# Patient Record
Sex: Male | Born: 1940 | Race: White | Hispanic: No | Smoking: Current every day smoker
Health system: Southern US, Community
[De-identification: ages and names within clinical notes are randomized; demographics above are authoritative.]

## PROBLEM LIST (undated history)

## (undated) DIAGNOSIS — I10 Essential (primary) hypertension: Secondary | ICD-10-CM

## (undated) DIAGNOSIS — C801 Malignant (primary) neoplasm, unspecified: Secondary | ICD-10-CM

## (undated) DIAGNOSIS — M1990 Unspecified osteoarthritis, unspecified site: Secondary | ICD-10-CM

## (undated) HISTORY — PX: SKIN CANCER EXCISION: SHX779

## (undated) HISTORY — PX: EXTERNAL EAR SURGERY: SHX627

---

## 2006-08-17 ENCOUNTER — Ambulatory Visit: Payer: Self-pay | Admitting: Family Medicine

## 2010-08-17 DIAGNOSIS — L570 Actinic keratosis: Secondary | ICD-10-CM | POA: Insufficient documentation

## 2010-10-13 DIAGNOSIS — C44320 Squamous cell carcinoma of skin of unspecified parts of face: Secondary | ICD-10-CM | POA: Insufficient documentation

## 2011-05-01 DIAGNOSIS — C4441 Basal cell carcinoma of skin of scalp and neck: Secondary | ICD-10-CM | POA: Insufficient documentation

## 2012-08-11 ENCOUNTER — Emergency Department: Payer: Self-pay | Admitting: Emergency Medicine

## 2012-08-11 LAB — CBC
HCT: 37.8 % — ABNORMAL LOW (ref 40.0–52.0)
MCHC: 34.5 g/dL (ref 32.0–36.0)
MCV: 87 fL (ref 80–100)
Platelet: 160 10*3/uL (ref 150–440)
RBC: 4.35 10*6/uL — ABNORMAL LOW (ref 4.40–5.90)
RDW: 14.1 % (ref 11.5–14.5)
WBC: 9.8 10*3/uL (ref 3.8–10.6)

## 2012-08-26 ENCOUNTER — Emergency Department: Payer: Self-pay | Admitting: Emergency Medicine

## 2014-03-09 DIAGNOSIS — R634 Abnormal weight loss: Secondary | ICD-10-CM | POA: Insufficient documentation

## 2014-03-09 DIAGNOSIS — R61 Generalized hyperhidrosis: Secondary | ICD-10-CM | POA: Insufficient documentation

## 2014-05-06 DIAGNOSIS — Z85828 Personal history of other malignant neoplasm of skin: Secondary | ICD-10-CM | POA: Insufficient documentation

## 2014-11-04 DIAGNOSIS — Z720 Tobacco use: Secondary | ICD-10-CM | POA: Insufficient documentation

## 2015-08-24 ENCOUNTER — Encounter: Payer: Self-pay | Admitting: *Deleted

## 2015-08-26 ENCOUNTER — Encounter
Admission: RE | Disposition: A | Discharge status: Home / Self Care | Payer: Self-pay | Source: Ambulatory Visit | Attending: Ophthalmology

## 2015-08-26 ENCOUNTER — Ambulatory Visit: Payer: Medicare Other | Admitting: Anesthesiology

## 2015-08-26 ENCOUNTER — Ambulatory Visit
Admission: RE | Admit: 2015-08-26 | Discharge: 2015-08-26 | Disposition: A | Discharge status: Home / Self Care | Payer: Medicare Other | Source: Ambulatory Visit | Attending: Ophthalmology | Admitting: Ophthalmology

## 2015-08-26 ENCOUNTER — Encounter: Payer: Self-pay | Admitting: *Deleted

## 2015-08-26 DIAGNOSIS — Z79899 Other long term (current) drug therapy: Secondary | ICD-10-CM | POA: Diagnosis not present

## 2015-08-26 DIAGNOSIS — E7800 Pure hypercholesterolemia, unspecified: Secondary | ICD-10-CM | POA: Diagnosis not present

## 2015-08-26 DIAGNOSIS — R05 Cough: Secondary | ICD-10-CM | POA: Diagnosis not present

## 2015-08-26 DIAGNOSIS — Z85828 Personal history of other malignant neoplasm of skin: Secondary | ICD-10-CM | POA: Diagnosis not present

## 2015-08-26 DIAGNOSIS — H2511 Age-related nuclear cataract, right eye: Secondary | ICD-10-CM | POA: Insufficient documentation

## 2015-08-26 DIAGNOSIS — M1990 Unspecified osteoarthritis, unspecified site: Secondary | ICD-10-CM | POA: Insufficient documentation

## 2015-08-26 DIAGNOSIS — F17200 Nicotine dependence, unspecified, uncomplicated: Secondary | ICD-10-CM | POA: Insufficient documentation

## 2015-08-26 DIAGNOSIS — I10 Essential (primary) hypertension: Secondary | ICD-10-CM | POA: Diagnosis not present

## 2015-08-26 HISTORY — DX: Essential (primary) hypertension: I10

## 2015-08-26 HISTORY — PX: CATARACT EXTRACTION W/PHACO: SHX586

## 2015-08-26 HISTORY — DX: Unspecified osteoarthritis, unspecified site: M19.90

## 2015-08-26 HISTORY — DX: Malignant (primary) neoplasm, unspecified: C80.1

## 2015-08-26 SURGERY — PHACOEMULSIFICATION, CATARACT, WITH IOL INSERTION
Anesthesia: Monitor Anesthesia Care | Site: Eye | Laterality: Right | Wound class: Clean

## 2015-08-26 MED ORDER — MOXIFLOXACIN HCL 0.5 % OP SOLN
OPHTHALMIC | Status: AC
  Filled 2015-08-26: qty 3

## 2015-08-26 MED ORDER — MOXIFLOXACIN HCL 0.5 % OP SOLN: 1.0000 [drp] | OPHTHALMIC | Status: DC | PRN

## 2015-08-26 MED ORDER — TETRACAINE HCL 0.5 % OP SOLN
1.0000 [drp] | Freq: Once | OPHTHALMIC | Status: AC
  Administered 2015-08-26: 1 [drp] via OPHTHALMIC

## 2015-08-26 MED ORDER — TETRACAINE HCL 0.5 % OP SOLN
OPHTHALMIC | Status: AC
  Administered 2015-08-26: 1 [drp] via OPHTHALMIC
  Filled 2015-08-26: qty 2

## 2015-08-26 MED ORDER — NA CHONDROIT SULF-NA HYALURON 40-17 MG/ML IO SOLN
INTRAOCULAR | Status: DC | PRN
  Administered 2015-08-26: 1 mL via INTRAOCULAR

## 2015-08-26 MED ORDER — BSS IO SOLN
INTRAOCULAR | Status: DC | PRN
  Administered 2015-08-26: 200 mL via OPHTHALMIC

## 2015-08-26 MED ORDER — MOXIFLOXACIN HCL 0.5 % OP SOLN
OPHTHALMIC | Status: DC | PRN
  Administered 2015-08-26: 1 [drp] via OPHTHALMIC

## 2015-08-26 MED ORDER — MIDAZOLAM HCL 5 MG/5ML IJ SOLN
INTRAMUSCULAR | Status: DC | PRN
  Administered 2015-08-26: 1 mg via INTRAVENOUS

## 2015-08-26 MED ORDER — NA HYALUR & NA CHOND-NA HYALUR 0.55-0.5 ML IO KIT
PACK | INTRAOCULAR | Status: AC
  Filled 2015-08-26: qty 1.05

## 2015-08-26 MED ORDER — POVIDONE-IODINE 5 % OP SOLN
OPHTHALMIC | Status: AC
  Administered 2015-08-26: 1 via OPHTHALMIC
  Filled 2015-08-26: qty 30

## 2015-08-26 MED ORDER — CEFUROXIME OPHTHALMIC INJECTION 1 MG/0.1 ML
INJECTION | OPHTHALMIC | Status: AC
  Filled 2015-08-26: qty 0.1

## 2015-08-26 MED ORDER — POLYMYXIN B-TRIMETHOPRIM 10000-0.1 UNIT/ML-% OP SOLN
OPHTHALMIC | Status: DC | PRN
  Administered 2015-08-26: 1 [drp] via OPHTHALMIC

## 2015-08-26 MED ORDER — EPINEPHRINE HCL 1 MG/ML IJ SOLN
INTRAMUSCULAR | Status: AC
  Filled 2015-08-26: qty 1

## 2015-08-26 MED ORDER — SODIUM CHLORIDE 0.9 % IV SOLN
INTRAVENOUS | Status: DC
  Administered 2015-08-26: 08:00:00 via INTRAVENOUS

## 2015-08-26 MED ORDER — BSS IO SOLN
INTRAOCULAR | Status: DC | PRN
  Administered 2015-08-26: 4 mL via OPHTHALMIC

## 2015-08-26 MED ORDER — POVIDONE-IODINE 5 % OP SOLN
1.0000 "application " | Freq: Once | OPHTHALMIC | Status: AC
  Administered 2015-08-26: 1 via OPHTHALMIC

## 2015-08-26 MED ORDER — ARMC OPHTHALMIC DILATING GEL
1.0000 | OPHTHALMIC | Status: AC | PRN
Start: 2015-08-26 — End: 2015-08-26
  Administered 2015-08-26 (×2): 1 via OPHTHALMIC

## 2015-08-26 MED ORDER — ARMC OPHTHALMIC DILATING GEL
OPHTHALMIC | Status: AC
  Administered 2015-08-26: 1 via OPHTHALMIC
  Filled 2015-08-26: qty 0.25

## 2015-08-26 MED ORDER — CEFUROXIME OPHTHALMIC INJECTION 1 MG/0.1 ML
INJECTION | OPHTHALMIC | Status: DC | PRN
  Administered 2015-08-26: 0.1 mL via INTRACAMERAL

## 2015-08-26 SURGICAL SUPPLY — 22 items
CANNULA ANT/CHMB 27GA (MISCELLANEOUS) ×3 IMPLANT
CUP MEDICINE 2OZ PLAST GRAD ST (MISCELLANEOUS) ×3 IMPLANT
GLOVE BIO SURGEON STRL SZ8 (GLOVE) ×3 IMPLANT
GLOVE BIOGEL M 6.5 STRL (GLOVE) ×3 IMPLANT
GLOVE SURG LX 7.5 STRW (GLOVE) ×2
GLOVE SURG LX STRL 7.5 STRW (GLOVE) ×1 IMPLANT
GOWN STRL REUS W/ TWL LRG LVL3 (GOWN DISPOSABLE) ×2 IMPLANT
GOWN STRL REUS W/TWL LRG LVL3 (GOWN DISPOSABLE) ×4
LENS IOL ACRSF IQ PC 25.0 (Intraocular Lens) ×1 IMPLANT
LENS IOL ACRYSOF IQ POST 25.0 (Intraocular Lens) ×3 IMPLANT
PACK CATARACT (MISCELLANEOUS) ×3 IMPLANT
PACK CATARACT BRASINGTON LX (MISCELLANEOUS) ×3 IMPLANT
PACK EYE AFTER SURG (MISCELLANEOUS) ×3 IMPLANT
SOL BSS BAG (MISCELLANEOUS) ×3
SOL PREP PVP 2OZ (MISCELLANEOUS) ×3
SOLUTION BSS BAG (MISCELLANEOUS) ×1 IMPLANT
SOLUTION PREP PVP 2OZ (MISCELLANEOUS) ×1 IMPLANT
SYR 3ML LL SCALE MARK (SYRINGE) ×3 IMPLANT
SYR 5ML LL (SYRINGE) ×3 IMPLANT
SYR TB 1ML 27GX1/2 LL (SYRINGE) ×3 IMPLANT
WATER STERILE IRR 1000ML POUR (IV SOLUTION) ×3 IMPLANT
WIPE NON LINTING 3.25X3.25 (MISCELLANEOUS) ×3 IMPLANT

## 2015-08-26 NOTE — Discharge Instructions (Signed)
Eye Surgery Discharge Instructions  Expect mild scratchy sensation or mild soreness. DO NOT RUB YOUR EYE!  The day of surgery:  Minimal physical activity, but bed rest is not required  No reading, computer work, or close hand work  No bending, lifting, or straining.  May watch TV  For 24 hours:  No driving, legal decisions, or alcoholic beverages  Safety precautions  Eat anything you prefer: It is better to start with liquids, then soup then solid foods.  _____ Eye patch should be worn until postoperative exam tomorrow.  ____ Solar shield eyeglasses should be worn for comfort in the sunlight/patch while sleeping  Resume all regular medications including aspirin or Coumadin if these were discontinued prior to surgery. You may shower, bathe, shave, or wash your hair. Tylenol may be taken for mild discomfort.  Call your doctor if you experience significant pain, nausea, or vomiting, fever > 101 or other signs of infection. [phone] or [phone] Specific instructions:  Follow-up Information    Follow up with Benay Pillow, MD On 08/27/2015.   Specialty:  Ophthalmology   Why:  9:40   Contact information:   [address] [place] [address] [phone]

## 2015-08-26 NOTE — H&P (Signed)
  The History and Physical notes are on paper, have been signed, and are to be scanned. The patient remains stable and unchanged from the H&P.   Previous H&P reviewed, patient examined, and there are no changes.  Benay Pillow 08/26/2015 8:59 AM

## 2015-08-26 NOTE — Transfer of Care (Signed)
Immediate Anesthesia Transfer of Care Note  Patient: Alexander Rowe  Procedure(s) Performed: Procedure(s) with comments: CATARACT EXTRACTION PHACO AND INTRAOCULAR LENS PLACEMENT (IOC) (Right) - Lot [id number] [place]: 02:13.9 AP%: 17.2 CDE: 22.94  Patient Location: PACU  Anesthesia Type:MAC  Level of Consciousness: awake, alert , oriented and patient cooperative  Airway & Oxygen Therapy: Patient Spontanous Breathing  Post-op Assessment: Report given to RN, Post -op Vital signs reviewed and stable and Patient moving all extremities X 4  Post vital signs: Reviewed and stable  Last Vitals:  Filed Vitals:   08/26/15 0744  BP: 173/93  Pulse: 59  Temp: 35.3 C  Resp: 16    Last Pain: There were no vitals filed for this visit.       Complications: No apparent anesthesia complications

## 2015-08-26 NOTE — Op Note (Signed)
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 Item Serial No. Manufacturer Lot No. LRB No. Used  IMPLANT LENS - [id number] Intraocular Lens IMPLANT LENS [id number] ALCON   Right 1    25.0 D [id number]   ULTRASOUND TIME: 2 minutes 14 seconds, CDE 22.94  SURGEON:  Benay Pillow, MD   ANESTHESIA:  Topical with tetracaine drops and 2% Xylocaine jelly, augmented with 1% preservative-free intracameral lidocaine.    COMPLICATIONS:  None.   DESCRIPTION OF PROCEDURE:  The patient was identified in the holding room and transported to the operating room and placed in the supine position under the operating microscope. Theright eye was identified as the operative eye and it was prepped and draped in the usual sterile ophthalmic fashion.   A 1 millimeter clear-corneal paracentesis was made at the 10:00  position.  0.5 ml of preservative-free 1% lidocaine and epinephrine was injected into the anterior chamber. The anterior chamber was filled with Viscoat viscoelastic.  A 2.4 millimeter keratome was used to make a near-clear corneal incision at the 8:00 position. A curvilinear capsulorrhexis was made with a cystotome and capsulorrhexis forceps.  Balanced salt solution was used to hydrodissect and hydrodelineate the nucleus.   Phacoemulsification was then used in stop and chop fashion to remove the lens nucleus and epinucleus.  The remaining cortex was then removed using the irrigation and aspiration handpiece. Provisc was then placed into the capsular bag to distend it for lens placement.  A lens was then injected into the capsular bag.  The remaining viscoelastic was aspirated.  Wounds were hydrated with balanced  salt solution.  The anterior chamber was inflated to a physiologic pressure with balanced salt solution.  Cefuroxime 0.1 ml of a 10mg /ml solution was injected into the anterior chamber for a dose of 1 mg of intracameral antibiotic at the completion of the case.  No wound leaks were noted.  Topical Vigamox drops were applied to the eye.  The patient was taken to the recovery room in stable condition without complications of anesthesia or surgery.  Benay Pillow 08/26/2015, 9:50 AM

## 2015-08-26 NOTE — Anesthesia Preprocedure Evaluation (Signed)
Anesthesia Evaluation  Patient identified by MRN, date of birth, ID band Patient awake    Reviewed: Allergy & Precautions, H&P , NPO status , Patient's Chart, lab work & pertinent test results, reviewed documented beta blocker date and time   History of Anesthesia Complications Negative for: history of anesthetic complications  Airway Mallampati: I  TM Distance: >3 FB Neck ROM: full    Dental no notable dental hx. (+) Edentulous Upper, Edentulous Lower, Upper Dentures, Lower Dentures   Pulmonary neg shortness of breath, neg sleep apnea, COPD, Recent URI , Current Smoker,    Pulmonary exam normal breath sounds clear to auscultation       Cardiovascular Exercise Tolerance: Good hypertension, On Medications (-) angina(-) CAD, (-) Past MI, (-) Cardiac Stents and (-) CABG Normal cardiovascular exam(-) dysrhythmias (-) Valvular Problems/Murmurs Rhythm:regular Rate:Normal     Neuro/Psych negative neurological ROS  negative psych ROS   GI/Hepatic negative GI ROS, Neg liver ROS,   Endo/Other  negative endocrine ROS  Renal/GU negative Renal ROS  negative genitourinary   Musculoskeletal   Abdominal   Peds  Hematology negative hematology ROS (+)   Anesthesia Other Findings Past Medical History:   Hypertension                                                 Arthritis                                                    Cancer (HCC)                                                   Comment:skin   Cough                                                          Comment:chronic   Sinus drainage                                               Reproductive/Obstetrics negative OB ROS                             Anesthesia Physical Anesthesia Plan  ASA: II  Anesthesia Plan: MAC   Post-op Pain Management:    Induction:   Airway Management Planned:   Additional Equipment:   Intra-op Plan:    Post-operative Plan:   Informed Consent: I have reviewed the patients History and Physical, chart, labs and discussed the procedure including the risks, benefits and alternatives for the proposed anesthesia with the patient or authorized representative who has indicated his/her understanding and acceptance.   Dental Advisory Given  Plan Discussed with: Anesthesiologist, CRNA and Surgeon  Anesthesia Plan Comments:  Anesthesia Quick Evaluation  

## 2015-08-26 NOTE — Anesthesia Postprocedure Evaluation (Signed)
Anesthesia Post Note  Patient: Alexander Rowe  Procedure(s) Performed: Procedure(s) (LRB): CATARACT EXTRACTION PHACO AND INTRAOCULAR LENS PLACEMENT (IOC) (Right)  Patient location during evaluation: PACU Anesthesia Type: MAC Level of consciousness: awake and alert Pain management: satisfactory to patient Vital Signs Assessment: post-procedure vital signs reviewed and stable Respiratory status: respiratory function stable Cardiovascular status: stable Anesthetic complications: no    Last Vitals:  Filed Vitals:   08/26/15 0744 08/26/15 0952  BP: 173/93 185/90  Pulse: 59 57  Temp: 35.3 C   Resp: 16 14    Last Pain:  Filed Vitals:   08/26/15 0953  PainSc: 0-No pain                 Silvana Newness A

## 2015-09-28 ENCOUNTER — Encounter: Payer: Self-pay | Admitting: *Deleted

## 2015-09-30 ENCOUNTER — Encounter
Admission: RE | Disposition: A | Discharge status: Home / Self Care | Payer: Self-pay | Source: Ambulatory Visit | Attending: Ophthalmology

## 2015-09-30 ENCOUNTER — Ambulatory Visit
Admission: RE | Admit: 2015-09-30 | Discharge: 2015-09-30 | Disposition: A | Discharge status: Home / Self Care | Payer: Medicare Other | Source: Ambulatory Visit | Attending: Ophthalmology | Admitting: Ophthalmology

## 2015-09-30 ENCOUNTER — Ambulatory Visit: Payer: Medicare Other | Admitting: Anesthesiology

## 2015-09-30 ENCOUNTER — Encounter: Payer: Self-pay | Admitting: *Deleted

## 2015-09-30 DIAGNOSIS — Z85828 Personal history of other malignant neoplasm of skin: Secondary | ICD-10-CM | POA: Diagnosis not present

## 2015-09-30 DIAGNOSIS — H2512 Age-related nuclear cataract, left eye: Secondary | ICD-10-CM | POA: Diagnosis not present

## 2015-09-30 DIAGNOSIS — H4089 Other specified glaucoma: Secondary | ICD-10-CM | POA: Insufficient documentation

## 2015-09-30 DIAGNOSIS — I10 Essential (primary) hypertension: Secondary | ICD-10-CM | POA: Diagnosis not present

## 2015-09-30 DIAGNOSIS — M1990 Unspecified osteoarthritis, unspecified site: Secondary | ICD-10-CM | POA: Diagnosis not present

## 2015-09-30 HISTORY — PX: CATARACT EXTRACTION W/PHACO: SHX586

## 2015-09-30 SURGERY — PHACOEMULSIFICATION, CATARACT, WITH IOL INSERTION
Anesthesia: Monitor Anesthesia Care | Site: Eye | Laterality: Left | Wound class: Clean

## 2015-09-30 MED ORDER — MOXIFLOXACIN HCL 0.5 % OP SOLN: 1.0000 [drp] | OPHTHALMIC | Status: DC | PRN

## 2015-09-30 MED ORDER — ONDANSETRON HCL 4 MG/2ML IJ SOLN
INTRAMUSCULAR | Status: DC | PRN
  Administered 2015-09-30: 4 mg via INTRAVENOUS

## 2015-09-30 MED ORDER — SODIUM HYALURONATE 10 MG/ML IO SOLN
INTRAOCULAR | Status: AC
  Filled 2015-09-30: qty 0.85

## 2015-09-30 MED ORDER — SODIUM HYALURONATE 23 MG/ML IO SOLN
INTRAOCULAR | Status: AC
  Filled 2015-09-30: qty 0.6

## 2015-09-30 MED ORDER — MIDAZOLAM HCL 2 MG/2ML IJ SOLN
INTRAMUSCULAR | Status: DC | PRN
  Administered 2015-09-30: 1 mg via INTRAVENOUS

## 2015-09-30 MED ORDER — ARMC OPHTHALMIC DILATING GEL
OPHTHALMIC | Status: AC
  Filled 2015-09-30: qty 0.25

## 2015-09-30 MED ORDER — TETRACAINE HCL 0.5 % OP SOLN
1.0000 [drp] | Freq: Once | OPHTHALMIC | Status: AC
  Administered 2015-09-30: 1 [drp] via OPHTHALMIC

## 2015-09-30 MED ORDER — MOXIFLOXACIN HCL 0.5 % OP SOLN
OPHTHALMIC | Status: AC
  Filled 2015-09-30: qty 3

## 2015-09-30 MED ORDER — LIDOCAINE HCL (PF) 4 % IJ SOLN
INTRAMUSCULAR | Status: DC | PRN
  Administered 2015-09-30: 12:00:00 via OPHTHALMIC

## 2015-09-30 MED ORDER — CEFUROXIME OPHTHALMIC INJECTION 1 MG/0.1 ML
INJECTION | OPHTHALMIC | Status: DC | PRN
  Administered 2015-09-30: 0.1 mL via INTRACAMERAL

## 2015-09-30 MED ORDER — SODIUM CHLORIDE 0.9 % IV SOLN
INTRAVENOUS | Status: DC
  Administered 2015-09-30: 11:00:00 via INTRAVENOUS

## 2015-09-30 MED ORDER — MOXIFLOXACIN HCL 0.5 % OP SOLN
OPHTHALMIC | Status: DC | PRN
  Administered 2015-09-30: 1 [drp] via OPHTHALMIC

## 2015-09-30 MED ORDER — SODIUM HYALURONATE 23 MG/ML IO SOLN
INTRAOCULAR | Status: DC | PRN
  Administered 2015-09-30: 0.6 mL via INTRAOCULAR

## 2015-09-30 MED ORDER — EPINEPHRINE HCL 1 MG/ML IJ SOLN
INTRAMUSCULAR | Status: DC | PRN
  Administered 2015-09-30: 12:00:00 via OPHTHALMIC

## 2015-09-30 MED ORDER — POVIDONE-IODINE 5 % OP SOLN
OPHTHALMIC | Status: AC
  Filled 2015-09-30: qty 30

## 2015-09-30 MED ORDER — POVIDONE-IODINE 5 % OP SOLN
1.0000 "application " | Freq: Once | OPHTHALMIC | Status: AC
  Administered 2015-09-30: 1 via OPHTHALMIC

## 2015-09-30 MED ORDER — EPINEPHRINE HCL 1 MG/ML IJ SOLN
INTRAMUSCULAR | Status: AC
  Filled 2015-09-30: qty 1

## 2015-09-30 MED ORDER — BSS IO SOLN
INTRAOCULAR | Status: DC | PRN
  Administered 2015-09-30: 15 mL

## 2015-09-30 MED ORDER — ARMC OPHTHALMIC DILATING GEL
1.0000 "application " | OPHTHALMIC | Status: DC | PRN
  Administered 2015-09-30: 1 via OPHTHALMIC

## 2015-09-30 MED ORDER — LIDOCAINE HCL (PF) 4 % IJ SOLN
INTRAMUSCULAR | Status: AC
  Filled 2015-09-30: qty 5

## 2015-09-30 MED ORDER — SODIUM HYALURONATE 10 MG/ML IO SOLN
INTRAOCULAR | Status: DC | PRN
  Administered 2015-09-30: 0.85 mL via INTRAOCULAR

## 2015-09-30 MED ORDER — TETRACAINE HCL 0.5 % OP SOLN
OPHTHALMIC | Status: AC
  Filled 2015-09-30: qty 2

## 2015-09-30 SURGICAL SUPPLY — 22 items
CANNULA ANT/CHMB 27GA (MISCELLANEOUS) ×3 IMPLANT
CUP MEDICINE 2OZ PLAST GRAD ST (MISCELLANEOUS) ×3 IMPLANT
GLOVE BIO SURGEON STRL SZ8 (GLOVE) ×3 IMPLANT
GLOVE BIOGEL M 6.5 STRL (GLOVE) ×3 IMPLANT
GLOVE SURG LX 7.5 STRW (GLOVE) ×2
GLOVE SURG LX STRL 7.5 STRW (GLOVE) ×1 IMPLANT
GOWN STRL REUS W/ TWL LRG LVL3 (GOWN DISPOSABLE) ×2 IMPLANT
GOWN STRL REUS W/TWL LRG LVL3 (GOWN DISPOSABLE) ×4
LENS IOL ACRSF IQ PC 24.0 (Intraocular Lens) ×1 IMPLANT
LENS IOL ACRYSOF IQ POST 24.0 (Intraocular Lens) ×3 IMPLANT
PACK CATARACT (MISCELLANEOUS) ×3 IMPLANT
PACK CATARACT BRASINGTON LX (MISCELLANEOUS) ×3 IMPLANT
PACK EYE AFTER SURG (MISCELLANEOUS) ×3 IMPLANT
SOL BSS BAG (MISCELLANEOUS) ×3
SOL PREP PVP 2OZ (MISCELLANEOUS) ×3
SOLUTION BSS BAG (MISCELLANEOUS) ×1 IMPLANT
SOLUTION PREP PVP 2OZ (MISCELLANEOUS) ×1 IMPLANT
SYR 3ML LL SCALE MARK (SYRINGE) ×3 IMPLANT
SYR 5ML LL (SYRINGE) ×3 IMPLANT
SYR TB 1ML 27GX1/2 LL (SYRINGE) ×3 IMPLANT
WATER STERILE IRR 1000ML POUR (IV SOLUTION) ×3 IMPLANT
WIPE NON LINTING 3.25X3.25 (MISCELLANEOUS) ×3 IMPLANT

## 2015-09-30 NOTE — H&P (Signed)
  The History and Physical notes are on paper, have been signed, and are to be scanned. The patient remains stable and unchanged from the H&P.   Previous H&P reviewed, patient examined, and there are no changes.  Alexander Rowe 09/30/2015 12:05 PM

## 2015-09-30 NOTE — Anesthesia Postprocedure Evaluation (Signed)
Anesthesia Post Note  Patient: Alexander Rowe  Procedure(s) Performed: Procedure(s) (LRB): CATARACT EXTRACTION PHACO AND INTRAOCULAR LENS PLACEMENT (IOC) (Left)  Anesthesia Type: MAC Level of consciousness: awake and alert Pain management: pain level controlled Vital Signs Assessment: post-procedure vital signs reviewed and stable Respiratory status: spontaneous breathing, nonlabored ventilation, respiratory function stable and patient connected to nasal cannula oxygen Cardiovascular status: stable and blood pressure returned to baseline Anesthetic complications: no    Last Vitals:  Filed Vitals:   09/30/15 1210 09/30/15 1254  BP: 126/64 172/87  Pulse: 47   Temp:  36.4 C  Resp: 16 12    Last Pain: There were no vitals filed for this visit.               Doreen Salvage A

## 2015-09-30 NOTE — Discharge Instructions (Signed)
Eye Surgery Discharge Instructions  Expect mild scratchy sensation or mild soreness. DO NOT RUB YOUR EYE!  The day of surgery:  Minimal physical activity, but bed rest is not required  No reading, computer work, or close hand work  No bending, lifting, or straining.  May watch TV  For 24 hours:  No driving, legal decisions, or alcoholic beverages  Safety precautions  Eat anything you prefer: It is better to start with liquids, then soup then solid foods.  _____ Eye patch should be worn until postoperative exam tomorrow.  ____ Solar shield eyeglasses should be worn for comfort in the sunlight/patch while sleeping  Resume all regular medications including aspirin or Coumadin if these were discontinued prior to surgery. You may shower, bathe, shave, or wash your hair. Tylenol may be taken for mild discomfort.  Call your doctor if you experience significant pain, nausea, or vomiting, fever > 101 or other signs of infection. [phone] or [phone] Specific instructions:  Follow-up Information    Follow up with Benay Pillow, MD.   Specialty:  Ophthalmology   Why:  foolow up 6/9 at [place] information:   [place] [address] [phone]     AMBULATORY SURGERY  DISCHARGE INSTRUCTIONS   1) The drugs that you were given will stay in your system until tomorrow so for the next 24 hours you should not:  A) Drive an automobile B) Make any legal decisions C) Drink any alcoholic beverage   2) You may resume regular meals tomorrow.  Today it is better to start with liquids and gradually work up to solid foods.  You may eat anything you prefer, but it is better to start with liquids, then soup and crackers, and gradually work up to solid foods.   3) Please notify your doctor immediately if you have any unusual bleeding, trouble breathing, redness and pain at the surgery site, drainage, fever, or pain not relieved by  medication.    4) Additional Instructions:        Please contact your physician with any problems or Same Day Surgery at [phone], Monday through Friday 6 am to 4 pm, or [place] [facility] number at [phone].

## 2015-09-30 NOTE — Op Note (Signed)
OPERATIVE NOTE  KELVIN LUNDE [id number] 09/30/2015   PREOPERATIVE DIAGNOSIS:  Dense Nuclear sclerotic cataract left eye.  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   POSTOPERATIVE DIAGNOSIS:    Nuclear sclerotic cataract left eye.   With phacomorphic glaucoma.   PROCEDURE:  Phacoemusification with posterior chamber intraocular lens placement of the left eye   LENS:   Implant Name Type Inv. Item Serial No. Manufacturer Lot No. LRB No. Used  IMPLANT LENS - [id number] Intraocular Lens IMPLANT LENS [id number] ALCON   Left 1       [id number] 24.0 D IOL.   ULTRASOUND TIME: 2 minutes 33 seconds.  CDE 29.47 (6 of which was outside of the eye, to unclog the phaco handpiece).   SURGEON:  Benay Pillow, MD, MPH   ANESTHESIA:  Topical with tetracaine drops and 2% Xylocaine jelly, augmented with 1% preservative-free intracameral lidocaine.   COMPLICATIONS:  None.   DESCRIPTION OF PROCEDURE:  The patient was identified in the holding room and transported to the operating room and placed in the supine position under the operating microscope.  The left eye was identified as the operative eye and it was prepped and draped in the usual sterile ophthalmic fashion.   A 1.0 millimeter clear-corneal paracentesis was made at the 5:00 position. 0.5 ml of preservative-free 1% lidocaine with epinephrine was injected into the anterior chamber.  The anterior chamber was filled with Healon 5 viscoelastic.  A 2.4 millimeter keratome was used to make a near-clear corneal incision at the 2:00 position.  A curvilinear capsulorrhexis was made with a cystotome and capsulorrhexis forceps.  Balanced salt solution was used to hydrodissect and hydrodelineate the nucleus.   Phacoemulsification was then used in stop and chop fashion to remove the lens nucleus and epinucleus.  The lens was extremely dense and required many chopping maneuvers.  The remaining cortex was then removed using the irrigation and aspiration handpiece.   There were a few small nuclear and epinuclear chips which were aspirated with the I/A handpiece by crushing the pieces with the anis ball.  Healon was then placed into the capsular bag to distend it for lens placement.  A lens was then injected into the capsular bag.  The remaining viscoelastic was aspirated.   Wounds were hydrated with balanced salt solution.  The anterior chamber was inflated to a physiologic pressure with balanced salt solution.   Intracameral cefuroxime 0.1 mL at 10mg /mL was injected into the eye.  No wound leaks were noted.  Topical Vigamox drops were applied to the eye.  The patient was taken to the recovery room in stable condition without complications of anesthesia or surgery  Benay Pillow 09/30/2015, 12:52 PM

## 2015-09-30 NOTE — Anesthesia Preprocedure Evaluation (Signed)
Anesthesia Evaluation  Patient identified by MRN, date of birth, ID band Patient awake    Reviewed: Allergy & Precautions, H&P , NPO status , Patient's Chart, lab work & pertinent test results, reviewed documented beta blocker date and time   History of Anesthesia Complications Negative for: history of anesthetic complications  Airway Mallampati: III  TM Distance: <3 FB Neck ROM: limited    Dental  (+) Edentulous Upper, Edentulous Lower, Upper Dentures, Lower Dentures, Poor Dentition, Missing   Pulmonary neg shortness of breath, neg sleep apnea, COPD, Recent URI , Current Smoker, former smoker,    Pulmonary exam normal breath sounds clear to auscultation       Cardiovascular Exercise Tolerance: Good hypertension, On Medications (-) angina(-) CAD, (-) Past MI, (-) Cardiac Stents and (-) CABG Normal cardiovascular exam(-) dysrhythmias (-) Valvular Problems/Murmurs Rhythm:regular Rate:Normal     Neuro/Psych negative neurological ROS  negative psych ROS   GI/Hepatic negative GI ROS, Neg liver ROS,   Endo/Other  negative endocrine ROS  Renal/GU negative Renal ROS  negative genitourinary   Musculoskeletal  (+) Arthritis ,   Abdominal   Peds  Hematology negative hematology ROS (+)   Anesthesia Other Findings Past Medical History:   Hypertension                                                 Arthritis                                                    Cancer (HCC)                                                   Comment:skin   Cough                                                          Comment:chronic   Sinus drainage                                              Past Surgical History:   SKIN CANCER EXCISION                                            Comment:x9   EXTERNAL EAR SURGERY                                            Comment:removal and reconstruction   CATARACT EXTRACTION W/PHACO  Right 08/26/2015       Comment:Procedure: CATARACT EXTRACTION PHACO AND               INTRAOCULAR LENS PLACEMENT (IOC);  Surgeon:               Eulogio Bear, MD;  Location: [facility] ORS;                Service: Ophthalmology;  Laterality: Right;                Lot [id number] [place]: 02:13.9 AP%: 17.2 CDE:               22.94  BMI    Body Mass Index   18.65 kg/m 2      Reproductive/Obstetrics negative OB ROS                             Anesthesia Physical  Anesthesia Plan  ASA: III  Anesthesia Plan: MAC   Post-op Pain Management:    Induction:   Airway Management Planned:   Additional Equipment:   Intra-op Plan:   Post-operative Plan:   Informed Consent: I have reviewed the patients History and Physical, chart, labs and discussed the procedure including the risks, benefits and alternatives for the proposed anesthesia with the patient or authorized representative who has indicated his/her understanding and acceptance.   Dental Advisory Given  Plan Discussed with: Anesthesiologist, CRNA and Surgeon  Anesthesia Plan Comments:         Anesthesia Quick Evaluation

## 2015-09-30 NOTE — Transfer of Care (Signed)
Immediate Anesthesia Transfer of Care Note  Patient: Alexander Rowe  Procedure(s) Performed: Procedure(s) with comments: CATARACT EXTRACTION PHACO AND INTRAOCULAR LENS PLACEMENT (IOC) (Left) - [place] 2.33 AP% 19.2 CDE 29.47 FLUID PACK LOT # [place]:[id number]   Patient Location: PHASE II  Anesthesia Type:MAC  Level of Consciousness: Awake, Alert, Oriented  Airway & Oxygen Therapy: Patient Spontanous Breathing and Patient on room air   Post-op Assessment: Report given to RN and Post -op Vital signs reviewed and stable  Post vital signs: Reviewed and stable  Last Vitals:  Filed Vitals:   09/30/15 1210 09/30/15 1254  BP: 126/64 172/87  Pulse: 47   Temp:  36.4 C  Resp: 16 12    Complications: No apparent anesthesia complications

## 2015-09-30 NOTE — Anesthesia Procedure Notes (Signed)
Procedure Name: MAC Date/Time: 09/30/2015 12:04 PM Performed by: Doreen Salvage Pre-anesthesia Checklist: Patient identified, Emergency Drugs available, Suction available and Patient being monitored Patient Re-evaluated:Patient Re-evaluated prior to inductionOxygen Delivery Method: Nasal cannula

## 2016-04-13 ENCOUNTER — Emergency Department
Admission: EM | Admit: 2016-04-13 | Discharge: 2016-04-13 | Disposition: A | Discharge status: Home / Self Care | Payer: Medicare Other | Attending: Emergency Medicine | Admitting: Emergency Medicine

## 2016-04-13 ENCOUNTER — Encounter: Payer: Self-pay | Admitting: Emergency Medicine

## 2016-04-13 ENCOUNTER — Emergency Department
Admission: EM | Admit: 2016-04-13 | Discharge: 2016-04-13 | Disposition: A | Discharge status: Home / Self Care | Payer: Medicare Other | Source: Home / Self Care | Attending: Emergency Medicine | Admitting: Emergency Medicine

## 2016-04-13 ENCOUNTER — Emergency Department: Payer: Medicare Other

## 2016-04-13 DIAGNOSIS — Z87891 Personal history of nicotine dependence: Secondary | ICD-10-CM

## 2016-04-13 DIAGNOSIS — Y939 Activity, unspecified: Secondary | ICD-10-CM

## 2016-04-13 DIAGNOSIS — Y999 Unspecified external cause status: Secondary | ICD-10-CM

## 2016-04-13 DIAGNOSIS — Z85820 Personal history of malignant melanoma of skin: Secondary | ICD-10-CM | POA: Insufficient documentation

## 2016-04-13 DIAGNOSIS — Z23 Encounter for immunization: Secondary | ICD-10-CM | POA: Insufficient documentation

## 2016-04-13 DIAGNOSIS — I10 Essential (primary) hypertension: Secondary | ICD-10-CM | POA: Insufficient documentation

## 2016-04-13 DIAGNOSIS — Y9389 Activity, other specified: Secondary | ICD-10-CM | POA: Insufficient documentation

## 2016-04-13 DIAGNOSIS — W11XXXA Fall on and from ladder, initial encounter: Secondary | ICD-10-CM

## 2016-04-13 DIAGNOSIS — Z85828 Personal history of other malignant neoplasm of skin: Secondary | ICD-10-CM | POA: Insufficient documentation

## 2016-04-13 DIAGNOSIS — W19XXXA Unspecified fall, initial encounter: Secondary | ICD-10-CM

## 2016-04-13 DIAGNOSIS — Y929 Unspecified place or not applicable: Secondary | ICD-10-CM | POA: Insufficient documentation

## 2016-04-13 DIAGNOSIS — S0101XA Laceration without foreign body of scalp, initial encounter: Secondary | ICD-10-CM

## 2016-04-13 DIAGNOSIS — S0990XA Unspecified injury of head, initial encounter: Secondary | ICD-10-CM | POA: Diagnosis present

## 2016-04-13 DIAGNOSIS — Z79899 Other long term (current) drug therapy: Secondary | ICD-10-CM | POA: Diagnosis not present

## 2016-04-13 DIAGNOSIS — W1839XA Other fall on same level, initial encounter: Secondary | ICD-10-CM | POA: Insufficient documentation

## 2016-04-13 MED ORDER — LIDOCAINE HCL (PF) 1 % IJ SOLN
5.0000 mL | Freq: Once | INTRAMUSCULAR | Status: AC
  Administered 2016-04-13: 5 mL

## 2016-04-13 MED ORDER — LIDOCAINE HCL (PF) 1 % IJ SOLN
INTRAMUSCULAR | Status: AC
  Administered 2016-04-13: 5 mL
  Filled 2016-04-13: qty 5

## 2016-04-13 MED ORDER — TETANUS-DIPHTH-ACELL PERTUSSIS 5-2.5-18.5 LF-MCG/0.5 IM SUSP
0.5000 mL | Freq: Once | INTRAMUSCULAR | Status: AC
  Administered 2016-04-13: 0.5 mL via INTRAMUSCULAR
  Filled 2016-04-13: qty 0.5

## 2016-04-13 NOTE — ED Notes (Signed)
Pt returned from CT °

## 2016-04-13 NOTE — ED Notes (Signed)
Report given to Nellie, RN.  

## 2016-04-13 NOTE — ED Notes (Signed)
MD Schaevitz placed additional staples. Tech at bedside to apply pressure for 10 minutes. MD and RN will continue to monitor

## 2016-04-13 NOTE — ED Notes (Signed)
MD Schaevitz at bedside. 

## 2016-04-13 NOTE — ED Notes (Signed)
This RN and Dr. Clearnce Hasten placed bandage around pt's head and wound at this time. Pt states bandage is not uncomfortable at this time. Will continue to monitor for further patient needs.

## 2016-04-13 NOTE — ED Notes (Signed)
Pt seen earlier today for head laceration. Pt had staples placed to dorsal head. Pt has bleed through bandages. RN and Tech to bedside to change dressing. Patient continues to bleed from staple site. Fresh dressing applied

## 2016-04-13 NOTE — ED Provider Notes (Signed)
[facility] Emergency Department Provider Note  ____________________________________________   First MD Initiated Contact with Patient 04/13/16 1754     (approximate)  I have reviewed the triage vital signs and the nursing notes.   HISTORY  Chief Complaint Coagulation Disorder   HPI Alexander Rowe is a 75 y.o. male with a history of hypertension who had a fall off a ladder earlier in the day and presented to the emergency department for already a visit earlier today who is presenting today with recurrent bleeding. I had seen him prior and had placed 10 staples in a occipitoparietal scalp wound. Bleeding had been controlled that time. He was discharged home. However, at this time he is returning with recurrent bleeding. He has not had a repeat fall.   Past Medical History:  Diagnosis Date  . Arthritis   . Cancer ([place])    skin  . Cough    chronic  . Hypertension   . Sinus drainage     There are no active problems to display for this patient.   Past Surgical History:  Procedure Laterality Date  . CATARACT EXTRACTION W/PHACO Right 08/26/2015   Procedure: CATARACT EXTRACTION PHACO AND INTRAOCULAR LENS PLACEMENT (IOC);  Surgeon: Eulogio Bear, MD;  Location: [facility] ORS;  Service: Ophthalmology;  Laterality: Right;  Lot [id number] [place]: 02:13.9 AP%: 17.2 CDE: 22.94  . CATARACT EXTRACTION W/PHACO Left 09/30/2015   Procedure: CATARACT EXTRACTION PHACO AND INTRAOCULAR LENS PLACEMENT (IOC);  Surgeon: Eulogio Bear, MD;  Location: [facility] ORS;  Service: Ophthalmology;  Laterality: Left;  [place] 2.33 AP% 19.2 CDE 29.47 FLUID PACK LOT # [id number]   . EXTERNAL EAR SURGERY     removal and reconstruction  . SKIN CANCER EXCISION     x9    Prior to Admission medications   Medication Sig Start Date End Date Taking? Authorizing Provider  atorvastatin (LIPITOR) 40 MG tablet Take 40 mg by mouth every morning.    Historical Provider, MD  hydrochlorothiazide  (HYDRODIURIL) 25 MG tablet Take 25 mg by mouth daily.    Historical Provider, MD  losartan (COZAAR) 25 MG tablet Take 25 mg by mouth daily.    Historical Provider, MD    Allergies Patient has no known allergies.  No family history on file.  Social History Social History  Substance Use Topics  . Smoking status: Former Smoker    Packs/day: 1.00  . Smokeless tobacco: Not on file  . Alcohol use Yes     Comment: couple beers weekends    Review of Systems Constitutional: No fever/chills Eyes: No visual changes. ENT: No sore throat. Cardiovascular: Denies chest pain. Respiratory: Denies shortness of breath. Gastrointestinal: No abdominal pain.  No nausea, no vomiting.  No diarrhea.  No constipation. Genitourinary: Negative for dysuria. Musculoskeletal: Negative for back pain. Skin: Negative for rash. Neurological: Negative for headaches, focal weakness or numbness.  10-point ROS otherwise negative.  ____________________________________________   PHYSICAL EXAM:  VITAL SIGNS: ED Triage Vitals [04/13/16 1743]  Enc Vitals Group     BP      Pulse      Resp      Temp      Temp src      SpO2      Weight 130 lb (59 kg)     Height 5\' 10"  (1.778 m)     Head Circumference      Peak Flow      Pain Score 3     Pain  Loc      Pain Edu?      Excl. in [place]?     Constitutional: Alert and oriented. Well appearing and in no acute distress. Eyes: Conjunctivae are normal. PERRL. EOMI. Head: Pulsatile bleeding from 1 cm laceration in the middle of a curvilinear laceration to the posterior occipital region of the scalp. Also small loose at 5:00 at the curvilinear lesion. Nose: No congestion/rhinnorhea. Mouth/Throat: Mucous membranes are moist.   Neck: No stridor.   Cardiovascular: Normal rate, regular rhythm. Grossly normal heart sounds.   Respiratory: Normal respiratory effort.  No retractions. Lungs CTAB. Gastrointestinal: Soft and nontender. No distention.  Musculoskeletal: No  lower extremity tenderness nor edema.  No joint effusions. Neurologic:  Normal speech and language. No gross focal neurologic deficits are appreciated.  Skin:  Skin is warm, dry and intact. No rash noted. Psychiatric: Mood and affect are normal. Speech and behavior are normal.  ____________________________________________   LABS (all labs ordered are listed, but only abnormal results are displayed)  Labs Reviewed - No data to display ____________________________________________  EKG   ____________________________________________  RADIOLOGY   ____________________________________________   PROCEDURES  Procedure(s) performed:  LACERATION REPAIR Performed by: Doran Stabler Authorized by: Doran Stabler Consent: Verbal consent obtained. Risks and benefits: risks, benefits and alternatives were discussed Consent given by: patient Patient identity confirmed: provided demographic data Prepped and Draped in normal sterile fashion Wound explored  Laceration Location: Occipitoparietal region.  Laceration Length: 1 cm 10 cm respectively cm  No Foreign Bodies seen or palpated  Anesthesia: local infiltration  Irrigation method: Cleansed with alcohol swab   Skin closure: 2 additional staples to the 1 cm laceration. One additional stable to curvilinear laceration    Patient tolerance: Patient tolerated the procedure well with no immediate complications.  Procedures  Critical Care performed:   ____________________________________________   INITIAL IMPRESSION / ASSESSMENT AND PLAN / ED COURSE  Pertinent labs & imaging results that were available during my care of the patient were reviewed by me and considered in my medical decision making (see chart for details).   Clinical Course   ----------------------------------------- 6:31 PM on 04/13/2016 -----------------------------------------  Surgeries so was placed over the laceration. Pressure was held. The  bleeding has now stopped. The patient will be discharged home. He is aware, as before, of return precautions as well as follow-up instructions.   ____________________________________________   FINAL CLINICAL IMPRESSION(S) / ED DIAGNOSES  Scalp laceration.    NEW MEDICATIONS STARTED DURING THIS VISIT:  New Prescriptions   No medications on file     Note:  This document was prepared using Dragon voice recognition software and may include unintentional dictation errors.    Orbie Pyo, MD 04/13/16 [phone]

## 2016-04-13 NOTE — ED Triage Notes (Signed)
Patient arrives to ED via POV after a fall off a latter. Patient states he fell 4 feet and hit his head on the concrete. Patient states he feel onto his bottom then hit his head. Patient denies being on blood thinners. MD at bedside. Active bleeding noted. Head laceration noted to top of head. Pressure being applied to laceration. Patient A&O x4. GCS 15.

## 2016-04-13 NOTE — ED Notes (Signed)
Pt taken to CT.

## 2016-04-13 NOTE — ED Provider Notes (Signed)
[facility] Emergency Department Provider Note  ____________________________________________   First MD Initiated Contact with Patient 04/13/16 1323     (approximate)  I have reviewed the triage vital signs and the nursing notes.   HISTORY  Chief Complaint Fall and Head Injury   HPI Alexander Rowe is a 75 y.o. male with a history of hypertension who is presenting to the emergency department after 4 foot fall. He says he was coming down a ladder after taking the nail out of a wall when he lost his footing and fell backward. He denies losing consciousness. Says that he has pain in the back of his head over the site of the laceration. Denies any nausea or vomiting.   Past Medical History:  Diagnosis Date  . Arthritis   . Cancer ([place])    skin  . Cough    chronic  . Hypertension   . Sinus drainage     There are no active problems to display for this patient.   Past Surgical History:  Procedure Laterality Date  . CATARACT EXTRACTION W/PHACO Right 08/26/2015   Procedure: CATARACT EXTRACTION PHACO AND INTRAOCULAR LENS PLACEMENT (IOC);  Surgeon: Eulogio Bear, MD;  Location: [facility] ORS;  Service: Ophthalmology;  Laterality: Right;  Lot [id number] [place]: 02:13.9 AP%: 17.2 CDE: 22.94  . CATARACT EXTRACTION W/PHACO Left 09/30/2015   Procedure: CATARACT EXTRACTION PHACO AND INTRAOCULAR LENS PLACEMENT (IOC);  Surgeon: Eulogio Bear, MD;  Location: [facility] ORS;  Service: Ophthalmology;  Laterality: Left;  [place] 2.33 AP% 19.2 CDE 29.47 FLUID PACK LOT # [id number]   . EXTERNAL EAR SURGERY     removal and reconstruction  . SKIN CANCER EXCISION     x9    Prior to Admission medications   Medication Sig Start Date End Date Taking? Authorizing Provider  atorvastatin (LIPITOR) 40 MG tablet Take 40 mg by mouth every morning.    Historical Provider, MD  hydrochlorothiazide (HYDRODIURIL) 25 MG tablet Take 25 mg by mouth daily.    Historical Provider, MD  losartan  (COZAAR) 25 MG tablet Take 25 mg by mouth daily.    Historical Provider, MD    Allergies Patient has no known allergies.  No family history on file.  Social History Social History  Substance Use Topics  . Smoking status: Former Smoker    Packs/day: 1.00  . Smokeless tobacco: Not on file  . Alcohol use Yes     Comment: couple beers weekends    Review of Systems Constitutional: No fever/chills Eyes: No visual changes. ENT: No sore throat. Cardiovascular: Denies chest pain. Respiratory: Denies shortness of breath. Gastrointestinal: No abdominal pain.  No nausea, no vomiting.  No diarrhea.  No constipation. Genitourinary: Negative for dysuria. Musculoskeletal: Negative for back pain. Skin: Negative for rash. Neurological: Negative for focal weakness or numbness.  10-point ROS otherwise negative.  ____________________________________________   PHYSICAL EXAM:  VITAL SIGNS: ED Triage Vitals  Enc Vitals Group     BP 04/13/16 1319 (!) 203/103     Pulse Rate 04/13/16 1319 80     Resp 04/13/16 1319 17     Temp 04/13/16 1319 97.7 F (36.5 C)     Temp Source 04/13/16 1319 Oral     SpO2 04/13/16 1319 97 %     Weight 04/13/16 1320 130 lb (59 kg)     Height 04/13/16 1320 5\' 10"  (1.778 m)     Head Circumference --      Peak Flow --  Pain Score 04/13/16 1320 7     Pain Loc --      Pain Edu? --      Excl. in [place]? --     Constitutional: Alert and oriented. Well appearing and in no acute distress. Eyes: Conjunctivae are normal. PERRL. EOMI. Head: Crescent shaped 10 cm laceration with pulsatile bleeding in the midline occipitoparietal region. The wound is clean. Pulsatile bleeding is at the most posterior point. There is also 1 cm superficial laceration within the curvilinear laceration that also has a very small amount of pulsatile bleeding. No bone or galea visualized. Nose: No congestion/rhinnorhea. Mouth/Throat: Mucous membranes are moist.   Neck: No stridor.  No  obvious tenderness to midline cervical spine. Cardiovascular: Normal rate, regular rhythm. Grossly normal heart sounds.   Respiratory: Normal respiratory effort.  No retractions. Lungs CTAB. Gastrointestinal: Soft and nontender. No distention.  Musculoskeletal: No lower extremity tenderness nor edema.  No joint effusions. Neurologic:  Normal speech and language. No gross focal neurologic deficits are appreciated. No gait instability. Skin:  Skin is warm, dry and intact. No rash noted. Psychiatric: Mood and affect are normal. Speech and behavior are normal.  ____________________________________________   LABS (all labs ordered are listed, but only abnormal results are displayed)  Labs Reviewed - No data to display ____________________________________________  EKG   ____________________________________________  RADIOLOGY  CT Head Wo Contrast (Final result)  Result time 04/13/16 14:53:48  Final result by Lahoma Crocker, MD (04/13/16 14:53:48)           Narrative:   CLINICAL DATA: [place] off ladder  EXAM: CT HEAD WITHOUT CONTRAST  CT CERVICAL SPINE WITHOUT CONTRAST  TECHNIQUE: Multidetector CT imaging of the head and cervical spine was performed following the standard protocol without intravenous contrast. Multiplanar CT image reconstructions of the cervical spine were also generated.  COMPARISON: 08/11/2012  FINDINGS: CT HEAD FINDINGS  Brain: No intracranial hemorrhage, mass effect or midline shift. Stable atrophy and chronic white matter disease. No acute cortical infarction. No mass lesion is noted on this unenhanced scan.  Vascular: No vascular calcifications are noted.  Skull: No skull fracture is noted. There is scalp swelling and subcutaneous air with skin irregularity/laceration in right posterior high convexity parietal region please see axial image 31.  Sinuses/Orbits: No intraorbital hematoma.  Other: None  CT CERVICAL SPINE FINDINGS  Alignment: The  alignment is preserved.  Skull base and vertebrae: No acute fracture or subluxation. There are degenerative changes C1-C2 articulation. Vertebral body heights is preserved. Minimal posterior spurring at C5-C6 and C6-C7 level.  Soft tissues and spinal canal: No prevertebral soft tissue swelling. Spinal canal is patent.  Disc levels: Minimal disc space flattening at C5-C6 and C6-C7 level.  Upper chest: There is no pneumothorax in visualized lung apices. Bilateral apical scarring is noted.  Other: None  IMPRESSION: 1. No acute intracranial abnormality. Stable atrophy and chronic white matter disease. 2. There is subcutaneous stranding and scalp injury in right parietal region high convexity. Clinical correlation is necessary. 3. No cervical spine acute fracture or subluxation. Mild degenerative changes as described above.   Electronically Signed By: Lahoma Crocker M.D. On: 04/13/2016 14:53            CT Cervical Spine Wo Contrast (Final result)  Result time 04/13/16 14:53:48  Final result by Lahoma Crocker, MD (04/13/16 14:53:48)           Narrative:   CLINICAL DATA: [place] off ladder  EXAM: CT HEAD WITHOUT CONTRAST  CT CERVICAL SPINE  WITHOUT CONTRAST  TECHNIQUE: Multidetector CT imaging of the head and cervical spine was performed following the standard protocol without intravenous contrast. Multiplanar CT image reconstructions of the cervical spine were also generated.  COMPARISON: 08/11/2012  FINDINGS: CT HEAD FINDINGS  Brain: No intracranial hemorrhage, mass effect or midline shift. Stable atrophy and chronic white matter disease. No acute cortical infarction. No mass lesion is noted on this unenhanced scan.  Vascular: No vascular calcifications are noted.  Skull: No skull fracture is noted. There is scalp swelling and subcutaneous air with skin irregularity/laceration in right posterior high convexity parietal region please see axial image  31.  Sinuses/Orbits: No intraorbital hematoma.  Other: None  CT CERVICAL SPINE FINDINGS  Alignment: The alignment is preserved.  Skull base and vertebrae: No acute fracture or subluxation. There are degenerative changes C1-C2 articulation. Vertebral body heights is preserved. Minimal posterior spurring at C5-C6 and C6-C7 level.  Soft tissues and spinal canal: No prevertebral soft tissue swelling. Spinal canal is patent.  Disc levels: Minimal disc space flattening at C5-C6 and C6-C7 level.  Upper chest: There is no pneumothorax in visualized lung apices. Bilateral apical scarring is noted.  Other: None  IMPRESSION: 1. No acute intracranial abnormality. Stable atrophy and chronic white matter disease. 2. There is subcutaneous stranding and scalp injury in right parietal region high convexity. Clinical correlation is necessary. 3. No cervical spine acute fracture or subluxation. Mild degenerative changes as described above.   Electronically Signed By: Lahoma Crocker M.D. On: 04/13/2016 14:53          ____________________________________________   PROCEDURES  Procedure(s) performed:   LACERATION REPAIR Performed by: Doran Stabler Authorized by: Doran Stabler Consent: Verbal consent obtained. Risks and benefits: risks, benefits and alternatives were discussed Consent given by: patient Patient identity confirmed: provided demographic data Prepped and Draped in normal sterile fashion Wound explored  Laceration Location:  Occipitoparietal at the midline  Laceration Length: 10cm curvilinear laceration as well as a 1 cm laceration independent of the curvilinear laceration.  No Foreign Bodies seen or palpated  Anesthesia: local infiltration  Local anesthetic: lidocaine 1% without epinephrine  Anesthetic total: 4 ml  Irrigation method: syringe Amount of cleaning: standard  Skin closure: Staples   Number : 10 staples     Patient tolerance:  Patient tolerated the procedure well with no immediate complications. Bleeding controlled. Pressure bandage applied.   Procedures  Critical Care performed:   ____________________________________________   INITIAL IMPRESSION / ASSESSMENT AND PLAN / ED COURSE  Pertinent labs & imaging results that were available during my care of the patient were reviewed by me and considered in my medical decision making (see chart for details).   Clinical Course   Cervical spine imaged based off of age and mechanism.  ----------------------------------------- 3:18 PM on 04/13/2016 -----------------------------------------  Patient resting of the lid this time. Bleeding has stopped. No acute intracranial abnormality or C-spine abnormality. Will be discharged home. Patient to follow-up in 7-10 days to have his staples removed at his primary care doctor. ____________________________________________   FINAL CLINICAL IMPRESSION(S) / ED DIAGNOSES  Fall. Scalp laceration.    NEW MEDICATIONS STARTED DURING THIS VISIT:  New Prescriptions   No medications on file     Note:  This document was prepared using Dragon voice recognition software and may include unintentional dictation errors.    Orbie Pyo, MD 04/13/16 [phone]

## 2016-08-29 DIAGNOSIS — C44619 Basal cell carcinoma of skin of left upper limb, including shoulder: Secondary | ICD-10-CM | POA: Diagnosis not present

## 2016-08-29 DIAGNOSIS — C44229 Squamous cell carcinoma of skin of left ear and external auricular canal: Secondary | ICD-10-CM | POA: Diagnosis not present

## 2016-08-29 DIAGNOSIS — D485 Neoplasm of uncertain behavior of skin: Secondary | ICD-10-CM | POA: Diagnosis not present

## 2016-08-29 DIAGNOSIS — C44612 Basal cell carcinoma of skin of right upper limb, including shoulder: Secondary | ICD-10-CM | POA: Diagnosis not present

## 2016-09-26 ENCOUNTER — Ambulatory Visit (INDEPENDENT_AMBULATORY_CARE_PROVIDER_SITE_OTHER): Payer: Medicare HMO | Admitting: Family Medicine

## 2016-09-26 ENCOUNTER — Encounter: Payer: Self-pay | Admitting: Family Medicine

## 2016-09-26 VITALS — BP 160/90 | HR 60 | Temp 97.8°F | Resp 16 | Ht 70.0 in | Wt 138.0 lb

## 2016-09-26 DIAGNOSIS — Z7689 Persons encountering health services in other specified circumstances: Secondary | ICD-10-CM | POA: Diagnosis not present

## 2016-09-26 DIAGNOSIS — L739 Follicular disorder, unspecified: Secondary | ICD-10-CM | POA: Diagnosis not present

## 2016-09-26 DIAGNOSIS — N183 Chronic kidney disease, stage 3 unspecified: Secondary | ICD-10-CM

## 2016-09-26 DIAGNOSIS — I129 Hypertensive chronic kidney disease with stage 1 through stage 4 chronic kidney disease, or unspecified chronic kidney disease: Secondary | ICD-10-CM | POA: Insufficient documentation

## 2016-09-26 DIAGNOSIS — C4490 Unspecified malignant neoplasm of skin, unspecified: Secondary | ICD-10-CM | POA: Diagnosis not present

## 2016-09-26 DIAGNOSIS — I10 Essential (primary) hypertension: Secondary | ICD-10-CM

## 2016-09-26 DIAGNOSIS — Z85828 Personal history of other malignant neoplasm of skin: Secondary | ICD-10-CM | POA: Diagnosis not present

## 2016-09-26 DIAGNOSIS — Z9889 Other specified postprocedural states: Secondary | ICD-10-CM | POA: Diagnosis not present

## 2016-09-26 MED ORDER — MUPIROCIN 2 % EX OINT: 1.0000 "application " | TOPICAL_OINTMENT | Freq: Two times a day (BID) | CUTANEOUS | 0 refills | Status: DC

## 2016-09-26 NOTE — Assessment & Plan Note (Addendum)
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

## 2016-09-26 NOTE — Patient Instructions (Addendum)
Thank you for coming to the clinic today.  1. For your folliculitis or bug bites or razor burn - Use Topical Antibiotic on affected areas twice daily for 2 weeks, to allow these to heal - If worsening, spreads redness, drainage of pus  You will be due for FASTING BLOOD WORK (no food or drink after midnight before, only water or coffee without cream/sugar on the morning of)  - Please go ahead and schedule a "Lab Only" visit in the morning at the clinic for lab draw in 6 weeks  Please schedule a Follow-up Appointment to: Return in about 6 weeks (around 11/07/2016) for Annual Physical.  If you have any other questions or concerns, please feel free to call the clinic or send a message through [place]. You may also schedule an earlier appointment if necessary.  [name], DO [place]

## 2016-09-26 NOTE — Progress Notes (Signed)
Subjective:    Patient ID: Alexander Rowe, male    DOB: 1941-04-22, 76 y.o.   MRN: [id number]  Alexander Rowe is a 76 y.o. male presenting on 09/26/2016 for Establish Care (pt is concerned about bug bites)  New patient, previously established with PCP at [facility], now he is interested in establishing care with PCP in [place].  [facility], now he states he "is no longer interested in going there"  HPI   Specialist: Dermatology / Surgery  - [facility]  CHRONIC HTN: Reports persistent problem in past, states medications have been changed before. He does not currently check BP, unsure what average readings are. Chart review shows significantly elevated BP for past 1 year with readings 160-200/90-80-100 Current Meds - HCTZ 25mg , Losartan 25mg    Reports good compliance, but did NOT take meds today. Tolerating well, w/o complaints. Lifestyle - active, occasionally uses exercise machine, not regularly Denies CP, dyspnea, HA, edema, dizziness / lightheadedness  Elevated Creatinine / CKD-III - Last lab test in 2016 with Cr 1.21 (prior value 1.35 in 2015) - due for labs has not had any recently drawn or none available for review - He is not familiar with known dx CKD - Not taking any NSAIDs (Ibuprofen, Advil, Aleve), he has been told to avoid these in past  Folliculitis, Groin: - Reports he has shaved pubic region recently and has had several small red irritated "bumps" describes rash concern for infection vs bug bite. He said he saw a "flying ant" that was crawling on his skin in this region recently. He is also sexually active and asking about STD testing, he denies any known exposure to STD or any genital symptoms - Tried using hydrogen peroxide topically on this area - Denies itching, ulceration, drainage of pus, fevers/chills  Non-Melanoma Skin Cancer (Squamous Cell, Basal Cell) - Followed by [facility] D[facility] for chronic problem has had issues with skin cancer and abnormal  pre-cancerous lesions since 1984 (about 12 years after served in [place]), has concerns about high amount of sun damage with UV rays in [place] and also possible chemical exposure - Reports that Dermatology has been managing several areas mostly on head, neck, and upper extremities, sun exposed areas (Left arm > Right arm), has had multiple rounds of cryotherapy and topical 5-FU chemical treatment - Additionally had been followed by [facility] Derm[facility]Surgery for MOHS surgery, done initially in 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 He states he has been to [facility] in [place] before, but cannot clarify reason and diagnosis or treatment. Chart review does not have diagnosis. Not focus of visit today  (NOTE patient stated at end of visit that he did not fully understand the PHQ questionnaire given to him, but his results were entered regardless today). Depression screen PHQ 2/9 09/26/2016  Decreased Interest 3  Down, Depressed, Hopeless 0  PHQ - 2 Score 3  Altered sleeping 3  Tired, decreased energy 3  Change in appetite 3  Feeling bad or failure about yourself  0  Trouble concentrating 0  Moving slowly or fidgety/restless 3  Suicidal thoughts 0  PHQ-9  Score 15     Fall Risk  09/26/2016  Falls in the past year? Yes  Number falls in past yr: 2 or more  Risk Factor Category  High Fall Risk    Past Medical History:  Diagnosis Date  . Arthritis   . Cancer ([place])    skin  . Hypertension    Past Surgical History:  Procedure Laterality Date  . CATARACT EXTRACTION W/PHACO Right 08/26/2015   Procedure: CATARACT EXTRACTION PHACO AND INTRAOCULAR LENS PLACEMENT (IOC);   Surgeon: Eulogio Bear, MD;  Location: [facility] ORS;  Service: Ophthalmology;  Laterality: Right;  Lot #[id number] [place]: 02:13.9 AP%: 17.2 CDE: 22.94  . CATARACT EXTRACTION W/PHACO Left 09/30/2015   Procedure: CATARACT EXTRACTION PHACO AND INTRAOCULAR LENS PLACEMENT (IOC);  Surgeon: Eulogio Bear, MD;  Location: [facility] ORS;  Service: Ophthalmology;  Laterality: Left;  [place] 2.33 AP% 19.2 CDE 29.47 FLUID PACK LOT # [id number]   . EXTERNAL EAR SURGERY     removal and reconstruction  . SKIN CANCER EXCISION     x9   Social History   Social History  . Marital status: Widowed    Spouse name: N/A  . Number of children: N/A  . Years of education: High School   Occupational History  . Retired ([place] [employer])    Social History Main Topics  . Smoking status: Current Every Day Smoker    Packs/day: 1.00    Years: 50.00  . Smokeless tobacco: Current User  . Alcohol use Yes     Comment: couple beers weekends  . Drug use: No  . Sexual activity: Yes   Other Topics Concern  . Not on file   Social History Narrative  . No narrative on file   History reviewed. No pertinent family history. Current Outpatient Prescriptions on File Prior to Visit  Medication Sig  . hydrochlorothiazide (HYDRODIURIL) 25 MG tablet Take 25 mg by mouth daily.  Marland Kitchen losartan (COZAAR) 25 MG tablet Take 25 mg by mouth daily.  Marland Kitchen atorvastatin (LIPITOR) 40 MG tablet Take 40 mg by mouth every morning.   No current facility-administered medications on file prior to visit.     Review of Systems  Constitutional: Positive for unexpected weight change (Weight loss, however he appears to have gained wt by todays visit). Negative for activity change, appetite change, chills, diaphoresis, fatigue and fever.  HENT: Negative for congestion, hearing loss and sinus pressure.   Eyes: Negative for visual disturbance.  Respiratory: Negative for cough, chest tightness, shortness of breath and wheezing.   Cardiovascular: Negative for  chest pain, palpitations and leg swelling.  Gastrointestinal: Negative for abdominal pain, constipation, diarrhea, nausea and vomiting.  Endocrine: Negative for cold intolerance and polyuria.  Genitourinary: Negative for decreased urine volume, difficulty urinating, dysuria, frequency and hematuria.  Musculoskeletal: Positive for arthralgias (Hips and other joints). Negative for back pain and neck pain.  Skin: Positive for rash (In groin).       S/p multiple surgery/cryotherapy for skin cancer, has areas of itching, tender, and non healing  Allergic/Immunologic: Negative for environmental allergies.  Neurological: Negative for dizziness, weakness, light-headedness, numbness and headaches.  Hematological: Negative for adenopathy.  Psychiatric/Behavioral: Negative for behavioral problems, dysphoric mood, self-injury, sleep disturbance and suicidal ideas. The patient is not nervous/anxious.    Per HPI unless specifically indicated above     Objective:    BP (!) 160/90 (BP Location: Left Arm, Cuff Size: Normal)   Pulse 60   Temp 97.8 F (36.6 C) (Oral)  Resp 16   Ht 5\' 10"  (1.778 m)   Wt 138 lb (62.6 kg)   BMI 19.80 kg/m   Wt Readings from Last 3 Encounters:  09/26/16 138 lb (62.6 kg)  04/13/16 130 lb (59 kg)  04/13/16 130 lb (59 kg)    Physical Exam  Constitutional: He is oriented to person, place, and time. He appears well-developed and well-nourished. No distress.  Chronically ill appearing 77 year old male, mostly comfortable, cooperative  HENT:  Head: Normocephalic and atraumatic.  Mouth/Throat: Oropharynx is clear and moist.  Eyes: Conjunctivae are normal. Right eye exhibits no discharge. Left eye exhibits no discharge.  Neck: Normal range of motion. Neck supple. No thyromegaly present.  Cardiovascular: Normal rate, regular rhythm, normal heart sounds and intact distal pulses.   No murmur heard. Pulmonary/Chest: Effort normal and breath sounds normal. No respiratory  distress. He has no wheezes. He has no rales.  Genitourinary: Penis normal.  Genitourinary Comments: External genital exam performed today, see pubic rash note, otherwise no genital rash, ulceration, discharge or swelling.  Musculoskeletal: He exhibits no edema.  Thin appearance of muscles with mild atrophy generalized  Lymphadenopathy:    He has no cervical adenopathy.  Neurological: He is alert and oriented to person, place, and time.  Skin: Skin is warm and dry. Rash (Small non raised slightly inflamed hair follicles in groin/ pubic region, s/p shaved, several areas without obvious erythema, abscess, induration) noted. He is not diaphoretic. No erythema.  Significant evidence of sun exposure damage to most areas of exposed skin upper extremities head, neck. Many areas of old cryotherapy prior scars. Including face with scar tissue tip of nose, and forehead/temple region and Left ear with prior surgical evidence.  Psychiatric: He has a normal mood and affect.  Well groomed, good eye contact, normal speech and thoughts but he has poor insight into health, has some difficulty with memory and staying focused on questions at times. Thought content is appropriate.  Nursing note and vitals reviewed.  Results for orders placed or performed in visit on 08/11/12  CBC  Result Value Ref Range   WBC 9.8 3.8 - 10.6 x10 3/mm 3   RBC 4.35 (L) 4.40 - 5.90 x10 6/mm 3   HGB 13.0 13.0 - 18.0 g/dL   HCT 37.8 (L) 40.0 - 52.0 %   MCV 87 80 - 100 fL   MCH 29.9 26.0 - 34.0 pg   MCHC 34.5 32.0 - 36.0 g/dL   RDW 14.1 11.5 - 14.5 %   Platelet 160 150 - 440 x10 3/mm 3  Protime-INR  Result Value Ref Range   Prothrombin Time 12.9 11.5 - 14.7 secs   INR 1.0       Assessment & Plan:   Problem List Items Addressed This Visit    Skin cancer    Chronic history of multiple non-melanoma skin cancer (NMSC), SCC, BCC Followed by [facility] Dermatology/Surgery Multiple sites head/neck and arms, s/p cryotherapy and surgical  excision, topical chemotherapy - secondary to high risk sun exposure chronically - has some chronic pain and skin irritation related to these problems and therapies      Hypertension - Primary    Uncontrolled HTN, repeat manual reading still very abnormally elevated - Home BP readings not available - Chart review prior readings for past 1 year, all very elevated >195-093/26-712  Complication with CKD based on last Creatinine 2 years ago, none recently available - No [facility]   Plan:  1. Continue current BP regimen - HCTZ 25mg , Losartan 25mg  - did not take meds today, concern for some medication non-adherence 2. Encourage improved lifestyle - low sodium diet, regular exercise 3. Start monitor BP outside office, bring readings to next visit, if persistently >150/90 or new symptoms notify office sooner 4. Follow-up within 6 weeks for Annual Physical + labs - will also consider possible HH referral for change of status      History of Moh's micrographic surgery for skin cancer    Other Visit Diagnoses    Encounter to establish care with new doctor       Acute folliculitis      Clinically consistent with folliculitis rash in pubic region in area s/p shaved pubic hair. No evidence of cellulitis or focal abscess, ulceration of any evidence of STD or bug bite. - Trial of topical antibiotic first. Not indicated for oral antibiotic - Will check future STD screening based on patient request    Relevant Medications   mupirocin ointment (BACTROBAN) 2 %      Meds ordered this encounter  Medications  . fluorouracil (EFUDEX) 5 % cream    Sig: To affected red scaly areas.  Redness and inflammation expected.  . mupirocin ointment (BACTROBAN) 2 %    Sig: Place 1 application into the nose 2 (two) times daily. On affected areas for 2 weeks    Dispense:  30 g    Refill:  0    Follow up plan: Return in about 6 weeks (around 11/07/2016) for Annual Physical.  Future labs  ordered including STD screening, PSA  [name], [facility] 09/27/2016, 12:05 AM

## 2016-09-27 ENCOUNTER — Encounter: Payer: Self-pay | Admitting: Family Medicine

## 2016-09-27 ENCOUNTER — Other Ambulatory Visit: Payer: Self-pay | Admitting: Family Medicine

## 2016-09-27 DIAGNOSIS — Z0000 Encounter for general adult medical examination without abnormal findings: Secondary | ICD-10-CM

## 2016-09-27 DIAGNOSIS — N183 Chronic kidney disease, stage 3 unspecified: Secondary | ICD-10-CM | POA: Insufficient documentation

## 2016-09-27 DIAGNOSIS — Z113 Encounter for screening for infections with a predominantly sexual mode of transmission: Secondary | ICD-10-CM

## 2016-09-27 DIAGNOSIS — Z862 Personal history of diseases of the blood and blood-forming organs and certain disorders involving the immune mechanism: Secondary | ICD-10-CM

## 2016-09-27 DIAGNOSIS — I10 Essential (primary) hypertension: Secondary | ICD-10-CM

## 2016-09-27 DIAGNOSIS — Z125 Encounter for screening for malignant neoplasm of prostate: Secondary | ICD-10-CM

## 2016-09-27 DIAGNOSIS — Z114 Encounter for screening for human immunodeficiency virus [HIV]: Secondary | ICD-10-CM

## 2016-09-27 DIAGNOSIS — R799 Abnormal finding of blood chemistry, unspecified: Secondary | ICD-10-CM

## 2016-09-27 NOTE — Assessment & Plan Note (Addendum)
Likely secondary to chronic HTN, age Old lab values, last in 2016 with Cr 1.2, prior 1.35 No recent data Advised improve hydration, avoid NSAIDs Will check labs within 6 weeks before annual physical

## 2016-09-27 NOTE — Assessment & Plan Note (Addendum)
Uncontrolled HTN, repeat manual reading still very abnormally elevated - Home BP readings not available - Chart review prior readings for past 1 year, all very elevated >[id number]/60-600  Complication with CKD based on last Creatinine 2 years ago, none recently available - No longer [facility]   Plan:  1. Continue current BP regimen - HCTZ 25mg , Losartan 25mg  - did not take meds today, concern for some medication non-adherence 2. Encourage improved lifestyle - low sodium diet, regular exercise 3. Start monitor BP outside office, bring readings to next visit, if persistently >150/90 or new symptoms notify office sooner 4. Follow-up within 6 weeks for Annual Physical + labs - will also consider possible [facility] referral for change of status

## 2016-09-28 DIAGNOSIS — C44229 Squamous cell carcinoma of skin of left ear and external auricular canal: Secondary | ICD-10-CM | POA: Diagnosis not present

## 2016-10-10 DIAGNOSIS — L089 Local infection of the skin and subcutaneous tissue, unspecified: Secondary | ICD-10-CM | POA: Diagnosis not present

## 2016-10-10 DIAGNOSIS — B9689 Other specified bacterial agents as the cause of diseases classified elsewhere: Secondary | ICD-10-CM | POA: Diagnosis not present

## 2016-11-03 ENCOUNTER — Other Ambulatory Visit: Payer: Medicare HMO

## 2016-11-07 ENCOUNTER — Encounter: Payer: Medicare HMO | Admitting: Family Medicine

## 2016-11-13 DIAGNOSIS — Z09 Encounter for follow-up examination after completed treatment for conditions other than malignant neoplasm: Secondary | ICD-10-CM | POA: Diagnosis not present

## 2016-11-13 DIAGNOSIS — Z85828 Personal history of other malignant neoplasm of skin: Secondary | ICD-10-CM | POA: Diagnosis not present

## 2016-11-15 ENCOUNTER — Other Ambulatory Visit: Payer: Medicare HMO

## 2016-11-15 DIAGNOSIS — N183 Chronic kidney disease, stage 3 unspecified: Secondary | ICD-10-CM

## 2016-11-15 DIAGNOSIS — R799 Abnormal finding of blood chemistry, unspecified: Secondary | ICD-10-CM

## 2016-11-15 DIAGNOSIS — I10 Essential (primary) hypertension: Secondary | ICD-10-CM

## 2016-11-15 DIAGNOSIS — Z862 Personal history of diseases of the blood and blood-forming organs and certain disorders involving the immune mechanism: Secondary | ICD-10-CM

## 2016-11-15 DIAGNOSIS — Z125 Encounter for screening for malignant neoplasm of prostate: Secondary | ICD-10-CM

## 2016-11-15 DIAGNOSIS — Z0000 Encounter for general adult medical examination without abnormal findings: Secondary | ICD-10-CM

## 2016-11-15 DIAGNOSIS — Z113 Encounter for screening for infections with a predominantly sexual mode of transmission: Secondary | ICD-10-CM | POA: Diagnosis not present

## 2016-11-15 DIAGNOSIS — Z114 Encounter for screening for human immunodeficiency virus [HIV]: Secondary | ICD-10-CM

## 2016-11-15 LAB — COMPLETE METABOLIC PANEL WITH GFR
ALBUMIN: 4.3 g/dL (ref 3.6–5.1)
ALT: 13 U/L (ref 9–46)
AST: 16 U/L (ref 10–35)
Alkaline Phosphatase: 54 U/L (ref 40–115)
BUN: 17 mg/dL (ref 7–25)
CALCIUM: 9.7 mg/dL (ref 8.6–10.3)
CHLORIDE: 100 mmol/L (ref 98–110)
CO2: 24 mmol/L (ref 20–31)
CREATININE: 1.4 mg/dL — AB (ref 0.70–1.18)
GFR, Est African American: 56 mL/min — ABNORMAL LOW (ref >=60)
GFR, Est Non African American: 48 mL/min — ABNORMAL LOW (ref >=60)
GLUCOSE: 83 mg/dL (ref 65–99)
Potassium: 4.1 mmol/L (ref 3.5–5.3)
SODIUM: 140 mmol/L (ref 135–146)
Total Bilirubin: 0.6 mg/dL (ref 0.2–1.2)
Total Protein: 6.6 g/dL (ref 6.1–8.1)

## 2016-11-15 LAB — LIPID PANEL
CHOLESTEROL: 211 mg/dL — AB (ref <200)
HDL: 88 mg/dL (ref >40)
LDL Cholesterol: 111 mg/dL — ABNORMAL HIGH (ref <100)
Total CHOL/HDL Ratio: 2.4 Ratio (ref <5.0)
Triglycerides: 62 mg/dL (ref <150)
VLDL: 12 mg/dL (ref <30)

## 2016-11-15 LAB — CBC WITH DIFFERENTIAL/PLATELET
BASOS PCT: 0 %
Basophils Absolute: 0 cells/uL (ref 0–200)
EOS ABS: 67 {cells}/uL (ref 15–500)
EOS PCT: 1 %
HCT: 38.5 % (ref 38.5–50.0)
Hemoglobin: 12.9 g/dL — ABNORMAL LOW (ref 13.2–17.1)
LYMPHS PCT: 22 %
Lymphs Abs: 1474 cells/uL (ref 850–3900)
MCH: 29.8 pg (ref 27.0–33.0)
MCHC: 33.5 g/dL (ref 32.0–36.0)
MCV: 88.9 fL (ref 80.0–100.0)
MONOS PCT: 8 %
MPV: 9.6 fL (ref 7.5–12.5)
Monocytes Absolute: 536 cells/uL (ref 200–950)
NEUTROS ABS: 4623 {cells}/uL (ref 1500–7800)
Neutrophils Relative %: 69 %
PLATELETS: 202 10*3/uL (ref 140–400)
RBC: 4.33 MIL/uL (ref 4.20–5.80)
RDW: 14.8 % (ref 11.0–15.0)
WBC: 6.7 10*3/uL (ref 3.8–10.8)

## 2016-11-16 LAB — HEMOGLOBIN A1C
HEMOGLOBIN A1C: 5.4 % (ref <5.7)
MEAN PLASMA GLUCOSE: 108 mg/dL

## 2016-11-16 LAB — RPR

## 2016-11-16 LAB — PSA, TOTAL WITH REFLEX TO PSA, FREE: PSA, TOTAL: 3.2 ng/mL (ref <=4.0)

## 2016-11-16 LAB — HIV ANTIBODY (ROUTINE TESTING W REFLEX): HIV: NONREACTIVE

## 2016-11-20 ENCOUNTER — Encounter: Payer: Medicare HMO | Admitting: Family Medicine

## 2016-12-05 ENCOUNTER — Ambulatory Visit (INDEPENDENT_AMBULATORY_CARE_PROVIDER_SITE_OTHER): Payer: Medicare HMO | Admitting: Family Medicine

## 2016-12-05 ENCOUNTER — Encounter: Payer: Self-pay | Admitting: Family Medicine

## 2016-12-05 VITALS — BP 142/70 | HR 72 | Temp 98.5°F | Resp 16 | Ht 70.0 in | Wt 141.0 lb

## 2016-12-05 DIAGNOSIS — E782 Mixed hyperlipidemia: Secondary | ICD-10-CM | POA: Diagnosis not present

## 2016-12-05 DIAGNOSIS — H9113 Presbycusis, bilateral: Secondary | ICD-10-CM | POA: Insufficient documentation

## 2016-12-05 DIAGNOSIS — I10 Essential (primary) hypertension: Secondary | ICD-10-CM | POA: Diagnosis not present

## 2016-12-05 DIAGNOSIS — C4490 Unspecified malignant neoplasm of skin, unspecified: Secondary | ICD-10-CM | POA: Diagnosis not present

## 2016-12-05 DIAGNOSIS — N183 Chronic kidney disease, stage 3 unspecified: Secondary | ICD-10-CM

## 2016-12-05 DIAGNOSIS — E785 Hyperlipidemia, unspecified: Secondary | ICD-10-CM | POA: Insufficient documentation

## 2016-12-05 DIAGNOSIS — Z862 Personal history of diseases of the blood and blood-forming organs and certain disorders involving the immune mechanism: Secondary | ICD-10-CM

## 2016-12-05 MED ORDER — HYDROCHLOROTHIAZIDE 25 MG PO TABS: 25.0000 mg | ORAL_TABLET | Freq: Every day | ORAL | 3 refills | Status: DC

## 2016-12-05 MED ORDER — LOSARTAN POTASSIUM 25 MG PO TABS: 25.0000 mg | ORAL_TABLET | Freq: Every day | ORAL | 3 refills | Status: DC

## 2016-12-05 NOTE — Progress Notes (Signed)
Subjective:    Patient ID: Alexander Rowe, male    DOB: 17-Apr-1941, 76 y.o.   MRN: [id number]  Alexander Rowe is a 76 y.o. male presenting on 12/05/2016 for Annual Exam   HPI   Specialist: Dermatology / Surgery  - [facility]  CHRONIC HTN: Not checking BP outside office, since last visit still. History of elevated BP in past uncontrolled for years. Current Meds - HCTZ 7m, Losartan 232mReports good compliance, but did NOT take meds today. Tolerating well, w/o complaints. Lifestyle - active, infrequently using exercise machine at home  CKD-III - Reviewed prior elevated Cr 1.2 to 1.4 on last lab. Discussion last vision on CKD - Today no new concerns. Drinks water regularly but sometimes does reports some dehydration - Not taking any NSAIDs (Ibuprofen, Advil, Aleve), he has been told to avoid these in past  PMH - Non-Melanoma Skin Cancer (Squamous Cell, Basal Cell) - [facility], s/p revision with prior history ear dehiscence from Mohs surgery, and history of MRSA infection  Bilateral Presbycusis / Other hearing injury - [facility] had audiology eval, but did not get hearing aids. Now asking about hearing aid eval.  History of Normocytic Anemia - Last Hgb 12.9. No history of bleeding problem. Prior colonoscopy normal. Not taking iron supplement.  History of Nocturia / Uncertain if history of BPH - Reports some nocturia overnight usually only x1 episode, otherwise no obstructive urinary symptoms - If drinks more water during day will have more urinary frequency  AUA BPH Symptom Score over past 1 month 1. Sensation of not emptying bladder post void - 0 2. Urinate less than 2 hour after finish last void - 0 3. Start/Stop several times during void - 0 4. Difficult to postpone urination - 0 5. Weak urinary stream - 0 6. Push or strain urination - 0 7. Nocturia - 1 times  Total Score: 1 (Mild BPH  symptoms)  Health Maintenance: - Routine HIV negative - Prostate CA Screening: Last PSA 3.2, mostly asymptomatic  - Due for Flu Shot in Fall 2018  [facility] 12/05/2016 09/26/2016  Decreased Interest 0 3  Down, Depressed, Hopeless 0 0  PHQ - 2 Score 0 3  Altered sleeping 0 3  Tired, decreased energy 0 3  Change in appetite 0 3  Feeling bad or failure about yourself  0 0  Trouble concentrating 0 0  Moving slowly or fidgety/restless 2 3  Suicidal thoughts 0 0  PHQ-9 Score 2 15  Difficult doing work/chores Not difficult at all -    Past Medical History:  Diagnosis Date  . Arthritis   . [place]   skin  . Hypertension    Past Surgical History:  Procedure Laterality Date  . CATARACT EXTRACTION W/PHACO Right 08/26/2015   Procedure: CATARACT EXTRACTION PHACO AND INTRAOCULAR LENS PLACEMENT (IOC);  Surgeon: BrEulogio BearMD;  Location: [facility] ORS;  Service: Ophthalmology;  Laterality: Right;  Lot #[id number] [place]:13.9 AP%: 17.2 CDE: 22.94  . CATARACT EXTRACTION W/PHACO Left 09/30/2015   Procedure: CATARACT EXTRACTION PHACO AND INTRAOCULAR LENS PLACEMENT (IOC);  Surgeon: BrEulogio BearMD;  Location: [facility] ORS;  Service: Ophthalmology;  Laterality: Left;  [place].33 AP% 19.2 CDE 29.47 FLUID PACK LOT # [id number]   . EXTERNAL EAR SURGERY     removal and reconstruction  . SKIN CANCER EXCISION     x9  Social History   Social History  . Marital status: Widowed    Spouse name: N/A  . Number of children: N/A  . Years of education: High School   Occupational History  . Retired ([employer])    Social History Main Topics  . Smoking status: Current Every Day Smoker    Packs/day: 1.00    Years: 50.00  . Smokeless tobacco: Current User  . Alcohol use Yes     Comment: couple beers weekends  . Drug use: No  . Sexual activity: Yes   Other Topics Concern  . Not on file   Social History Narrative  . No narrative on file   No family history on  file. Current Outpatient Prescriptions on File Prior to Visit  Medication Sig  . atorvastatin (LIPITOR) 40 MG tablet Take 40 mg by mouth every morning.  . fluorouracil (EFUDEX) 5 % cream To affected red scaly areas.  Redness and inflammation expected.  . mupirocin ointment (BACTROBAN) 2 % Place 1 application into the nose 2 (two) times daily. On affected areas for 2 weeks (Patient not taking: Reported on 12/05/2016)   No current facility-administered medications on file prior to visit.     Review of Systems  Constitutional: Negative for activity change, appetite change, chills, diaphoresis, fatigue and fever.  HENT: Negative for congestion and hearing loss.   Eyes: Negative for visual disturbance.  Respiratory: Negative for apnea, cough, chest tightness, shortness of breath and wheezing.   Cardiovascular: Negative for chest pain, palpitations and leg swelling.  Gastrointestinal: Negative for abdominal pain, anal bleeding, blood in stool, constipation, diarrhea, nausea and vomiting.  Endocrine: Negative for cold intolerance and polyuria.  Genitourinary: Negative for decreased urine volume, difficulty urinating, dysuria, frequency, hematuria and testicular pain.  Musculoskeletal: Negative for arthralgias and neck pain.  Skin: Positive for rash.       Abnormal skin spots Cyst on L wrist  Allergic/Immunologic: Negative for environmental allergies.  Neurological: Negative for dizziness, weakness, light-headedness, numbness and headaches.  Hematological: Negative for adenopathy.  Psychiatric/Behavioral: Negative for behavioral problems, dysphoric mood and sleep disturbance. The patient is not nervous/anxious.    Per HPI unless specifically indicated above     Objective:    BP (!) 142/70   Pulse 72   Temp 98.5 F (36.9 C) (Oral)   Resp 16   Ht 5' 10"  (1.778 m)   Wt 141 lb (64 kg)   BMI 20.23 kg/m   Wt Readings from Last 3 Encounters:  12/05/16 141 lb (64 kg)  09/26/16 138 lb (62.6  kg)  04/13/16 130 lb (59 kg)    Physical Exam  Constitutional: He is oriented to person, place, and time. He appears well-developed and well-nourished. No distress.  Chronically ill appearing 76 year old male, mostly comfortable, cooperative  HENT:  Head: Normocephalic and atraumatic.  Mouth/Throat: Oropharynx is clear and moist.  Eyes: Conjunctivae are normal. Right eye exhibits no discharge. Left eye exhibits no discharge.  Neck: Normal range of motion. Neck supple. No thyromegaly present.  Cardiovascular: Normal rate, regular rhythm, normal heart sounds and intact distal pulses.   No murmur heard. Pulmonary/Chest: Effort normal and breath sounds normal. No respiratory distress. He has no wheezes. He has no rales.  Genitourinary: Penis normal.  Genitourinary Comments: External genital exam performed today, see pubic rash note, otherwise no genital rash, ulceration, discharge or swelling.  Musculoskeletal: He exhibits no edema.  Thin appearance of muscles with mild atrophy generalized  Lymphadenopathy:  He has no cervical adenopathy.  Neurological: He is alert and oriented to person, place, and time.  Skin: Skin is warm and dry. No rash noted. He is not diaphoretic. No erythema.  Significant evidence of sun exposure damage to most areas of exposed skin upper extremities head, neck. Many areas of old cryotherapy prior scars. Including face with scar tissue tip of nose, and forehead/temple region and Left ear with prior surgical evidence.  Soft cyst left inner wrist 1 x 1 cm  Large benign appearing seborrheic keratoses on L flank  Psychiatric: He has a normal mood and affect.  Well groomed, good eye contact, normal speech and thoughts.  Nursing note and vitals reviewed.  Results for orders placed or performed in visit on 11/15/16  COMPLETE METABOLIC PANEL WITH GFR  Result Value Ref Range   Sodium 140 135 - 146 mmol/L   Potassium 4.1 3.5 - 5.3 mmol/L   Chloride 100 98 - 110 mmol/L    CO2 24 20 - 31 mmol/L   Glucose, Bld 83 65 - 99 mg/dL   BUN 17 7 - 25 mg/dL   Creat 1.40 (H) 0.70 - 1.18 mg/dL   Total Bilirubin 0.6 0.2 - 1.2 mg/dL   Alkaline Phosphatase 54 40 - 115 U/L   AST 16 10 - 35 U/L   ALT 13 9 - 46 U/L   Total Protein 6.6 6.1 - 8.1 g/dL   Albumin 4.3 3.6 - 5.1 g/dL   Calcium 9.7 8.6 - 10.3 mg/dL   GFR, Est African American 56 (L) >=60 mL/min   GFR, Est Non African American 48 (L) >=60 mL/min  Lipid panel  Result Value Ref Range   Cholesterol 211 (H) <200 mg/dL   Triglycerides 62 <150 mg/dL   HDL 88 >40 mg/dL   Total CHOL/HDL Ratio 2.4 <5.0 Ratio   VLDL 12 <30 mg/dL   LDL Cholesterol 111 (H) <100 mg/dL  CBC with Differential/Platelet  Result Value Ref Range   WBC 6.7 3.8 - 10.8 K/uL   RBC 4.33 4.20 - 5.80 MIL/uL   Hemoglobin 12.9 (L) 13.2 - 17.1 g/dL   HCT 38.5 38.5 - 50.0 %   MCV 88.9 80.0 - 100.0 fL   MCH 29.8 27.0 - 33.0 pg   MCHC 33.5 32.0 - 36.0 g/dL   RDW 14.8 11.0 - 15.0 %   Platelets 202 140 - 400 K/uL   MPV 9.6 7.5 - 12.5 fL   Neutro Abs 4,623 1,500 - 7,800 cells/uL   Lymphs Abs 1,474 850 - 3,900 cells/uL   Monocytes Absolute 536 200 - 950 cells/uL   Eosinophils Absolute 67 15 - 500 cells/uL   Basophils Absolute 0 0 - 200 cells/uL   Neutrophils Relative % 69 %   Lymphocytes Relative 22 %   Monocytes Relative 8 %   Eosinophils Relative 1 %   Basophils Relative 0 %   Smear Review Criteria for review not met   Hemoglobin A1c  Result Value Ref Range   Hgb A1c MFr Bld 5.4 <5.7 %   Mean Plasma Glucose 108 mg/dL  PSA, Total with Reflex to PSA, Free  Result Value Ref Range   PSA, Total 3.2 <=4.0 ng/mL  HIV antibody  Result Value Ref Range   HIV 1&2 Ab, 4th Generation NONREACTIVE NONREACTIVE  RPR  Result Value Ref Range   RPR Ser Ql NON REAC NON REAC      Assessment & Plan:   Problem List Items Addressed This Visit  Skin cancer    Followed by Lajuana Ripple      Presbycusis of both ears    Stable chronic problem, complicated  by ear surgery/Mohs skin cancer Prior audiology in [place], but never had hearing aid Requesting new referral - ordered to [facility] for hearing aid eval      Relevant Orders   Ambulatory referral to Audiology   Hypertension - Primary    Uncontrolled HTN, repeat manual reading seems improved significantly did not take med today - Home BP readings not available, still not checking - Chart review prior readings for past 1 year, all very elevated >[phone]  Complication with CKD-III - [facility]   Plan:  1. Continue current BP regimen - HCTZ 37m, Losartan 250m- did not take meds today, concern for some medication non-adherence 2. Encourage improved lifestyle - low sodium diet, regular exercise 3. Start monitor BP outside office, bring readings to next visit, if persistently >150/90 or new symptoms notify office sooner 4. Follow-up within 3 months - seems BP improved, may need future dose adjust inc losartan to 50 vs 3rd agent, consider HH eval adhere to meds      Relevant Medications   losartan (COZAAR) 25 MG tablet   hydrochlorothiazide (HYDRODIURIL) 25 MG tablet   Hyperlipidemia    Suboptimal control cholesterol on statin and poor lifestyle Last lipid panel 11/2016 Calculated ASCVD 10 yr risk score elevated  Plan: 1. Continue current meds - Atorvastatin 4019m. Consider ASA 27m29mr primary ASCVD risk reduction 3. Encourage improved lifestyle - low carb/cholesterol, reduce portion size, start regular exercise 4. Follow-up 3 months - lipids q yr      Relevant Medications   losartan (COZAAR) 25 MG tablet   hydrochlorothiazide (HYDRODIURIL) 25 MG tablet   History of anemia    Mild anemia Hgb 12.9, normocytic MCV No clear cause for anemia. Prior colonoscopy negative Most likely CKD component May consider OTC iron, otherwise reassurance for now      CKD (chronic kidney disease), stage III    Gradual inc Cr 1.4, GFR remains in range  CKD-III Likely secondary to age and chronic uncontrolled HTN Needs to improve hydration Control BP - improved today, needs better adherence to meds, consider inc Losartan to 50 or 3rd agent if needed Follow-up home BP readings, consider referral to Nephrology if needed         Meds ordered this encounter  Medications  . HYDROcodone-acetaminophen (NORCO/VICODIN) 5-325 MG tablet    Sig: Take by mouth.  . losartan (COZAAR) 25 MG tablet    Sig: Take 1 tablet (25 mg total) by mouth daily.    Dispense:  90 tablet    Refill:  3  . hydrochlorothiazide (HYDRODIURIL) 25 MG tablet    Sig: Take 1 tablet (25 mg total) by mouth daily.    Dispense:  90 tablet    Refill:  3    Follow up plan: Return in about 3 months (around 03/07/2017) for blood pressure.  AlexNobie Putnam [place] 12/05/2016, 11:06 PM

## 2016-12-05 NOTE — Assessment & Plan Note (Signed)
Stable chronic problem, complicated by ear surgery/Mohs skin cancer Prior audiology in [place], but never had hearing aid Requesting new referral - ordered to [facility] Audiology for hearing aid eval

## 2016-12-05 NOTE — Assessment & Plan Note (Signed)
Gradual inc Cr 1.4, GFR remains in range CKD-III Likely secondary to age and chronic uncontrolled HTN Needs to improve hydration Control BP - improved today, needs better adherence to meds, consider inc Losartan to 50 or 3rd agent if needed Follow-up home BP readings, consider referral to Nephrology if needed

## 2016-12-05 NOTE — Assessment & Plan Note (Signed)
Uncontrolled HTN, repeat manual reading seems improved significantly did not take med today - Home BP readings not available, still not checking - Chart review prior readings for past 1 year, all very elevated >[phone]  Complication with CKD-III - [facility]   Plan:  1. Continue current BP regimen - HCTZ 25mg , Losartan 25mg  - did not take meds today, concern for some medication non-adherence 2. Encourage improved lifestyle - low sodium diet, regular exercise 3. Start monitor BP outside office, bring readings to next visit, if persistently >150/90 or new symptoms notify office sooner 4. Follow-up within 3 months - seems BP improved, may need future dose adjust inc losartan to 50 vs 3rd agent, consider HH eval adhere to meds

## 2016-12-05 NOTE — Assessment & Plan Note (Signed)
Mild anemia Hgb 12.9, normocytic MCV No clear cause for anemia. Prior colonoscopy negative Most likely CKD component May consider OTC iron, otherwise reassurance for now

## 2016-12-05 NOTE — Assessment & Plan Note (Signed)
Suboptimal control cholesterol on statin and poor lifestyle Last lipid panel 11/2016 Calculated ASCVD 10 yr risk score elevated  Plan: 1. Continue current meds - Atorvastatin 40mg  2. Consider ASA 81mg  for primary ASCVD risk reduction 3. Encourage improved lifestyle - low carb/cholesterol, reduce portion size, start regular exercise 4. Follow-up 3 months - lipids q yr

## 2016-12-05 NOTE — Assessment & Plan Note (Signed)
Followed by Lajuana Ripple

## 2016-12-05 NOTE — Patient Instructions (Addendum)
Thank you for coming to the clinic today.  1.  Referral to Audiology for hearing aid assessment and hearing aids. They will call you with an appointment, if you do not hear back from anyone then call them to schedule apt  [id number]  [place], [place] [address] Ph: [phone]  2. Check your BP at home once every 1-2 weeks, or at pharmacy. Write down numbers, if >140/90, let me know, otherwise.  Refilled BP meds - we may need to adjust in future  Drink plenty of water.  Avoid anti-inflammatories (Advil, Ibuprofen, Aleve)  Please schedule a Follow-up Appointment to: Return in about 3 months (around 03/07/2017) for blood pressure.  If you have any other questions or concerns, please feel free to call the clinic or send a message through [place]. You may also schedule an earlier appointment if necessary.  Additionally, you may be receiving a survey about your experience at our clinic within a few days to 1 week by e-mail or mail. We value your feedback.  [name], DO [place]

## 2017-01-30 DIAGNOSIS — Z85828 Personal history of other malignant neoplasm of skin: Secondary | ICD-10-CM | POA: Diagnosis not present

## 2017-01-30 DIAGNOSIS — C44329 Squamous cell carcinoma of skin of other parts of face: Secondary | ICD-10-CM | POA: Diagnosis not present

## 2017-05-03 DIAGNOSIS — C44622 Squamous cell carcinoma of skin of right upper limb, including shoulder: Secondary | ICD-10-CM | POA: Diagnosis not present

## 2017-05-03 DIAGNOSIS — L578 Other skin changes due to chronic exposure to nonionizing radiation: Secondary | ICD-10-CM | POA: Diagnosis not present

## 2017-05-03 DIAGNOSIS — C44329 Squamous cell carcinoma of skin of other parts of face: Secondary | ICD-10-CM | POA: Diagnosis not present

## 2017-07-03 DIAGNOSIS — Z85828 Personal history of other malignant neoplasm of skin: Secondary | ICD-10-CM | POA: Diagnosis not present

## 2017-12-25 ENCOUNTER — Other Ambulatory Visit: Payer: Self-pay | Admitting: Family Medicine

## 2017-12-25 DIAGNOSIS — I10 Essential (primary) hypertension: Secondary | ICD-10-CM

## 2018-03-25 ENCOUNTER — Other Ambulatory Visit: Payer: Self-pay | Admitting: Nurse Practitioner

## 2018-03-25 DIAGNOSIS — I10 Essential (primary) hypertension: Secondary | ICD-10-CM

## 2018-05-29 ENCOUNTER — Other Ambulatory Visit: Payer: Self-pay

## 2018-06-28 ENCOUNTER — Encounter: Payer: Self-pay | Admitting: Family Medicine

## 2018-06-28 ENCOUNTER — Ambulatory Visit (INDEPENDENT_AMBULATORY_CARE_PROVIDER_SITE_OTHER): Payer: Medicare HMO | Admitting: Family Medicine

## 2018-06-28 VITALS — BP 163/89 | HR 77 | Temp 97.9°F | Resp 16 | Ht 70.0 in | Wt 143.0 lb

## 2018-06-28 DIAGNOSIS — J432 Centrilobular emphysema: Secondary | ICD-10-CM

## 2018-06-28 DIAGNOSIS — J441 Chronic obstructive pulmonary disease with (acute) exacerbation: Secondary | ICD-10-CM

## 2018-06-28 DIAGNOSIS — J0100 Acute maxillary sinusitis, unspecified: Secondary | ICD-10-CM | POA: Diagnosis not present

## 2018-06-28 MED ORDER — PREDNISONE 50 MG PO TABS: 50.0000 mg | ORAL_TABLET | Freq: Every day | ORAL | 0 refills | Status: DC

## 2018-06-28 MED ORDER — AMOXICILLIN-POT CLAVULANATE 875-125 MG PO TABS: 1.0000 | ORAL_TABLET | Freq: Two times a day (BID) | ORAL | 0 refills | Status: DC

## 2018-06-28 NOTE — Patient Instructions (Addendum)
Thank you for coming to the office today.  1. It sounds like you had an Upper Respiratory Virus that has settled into a Bronchitis, lower respiratory tract infection. I don't have concerns for pneumonia today, and think that this should gradually improve. Once you are feeling better, the cough may take a few weeks to fully resolve. I do hear wheezing and coarse breath sounds, this may be due to the virus, also could be related to smoking.  Start Augmentin 1 pill twice a day for 10 days  - Start Prednisone 50mg  daily for next 5 days - this will open up lungs allow you to breath better and treat that wheezing or bronchospasm  - Use nasal saline (Simply Saline or Ocean Spray) to flush nasal congestion multiple times a day, may help cough - Drink plenty of fluids to improve congestion  If your symptoms seem to worsen instead of improve over next several days, including significant fever / chills, worsening shortness of breath, worsening wheezing, or nausea / vomiting and can't take medicines - return sooner or go to hospital Emergency Department for more immediate treatment.  Leg cramps - Try spoonful of yellow mustard to relieve leg cramps or try daily to prevent the problem  - OTC natural option is Hyland's Leg Cramps (Dissolving tablet) take as needed for muscle cramps  Please schedule a Follow-up Appointment to: Return in about 2 weeks (around 07/12/2018), or if symptoms worsen or fail to improve, for COPD sinus.  If you have any other questions or concerns, please feel free to call the office or send a message through [place]. You may also schedule an earlier appointment if necessary.  Additionally, you may be receiving a survey about your experience at our office within a few days to 1 week by e-mail or mail. We value your feedback.  [name], DO [place]

## 2018-06-28 NOTE — Progress Notes (Signed)
Subjective:    Patient ID: Alexander Rowe, male    DOB: 1941-04-20, 78 y.o.   MRN: [id number]  Alexander Rowe is a 78 y.o. male presenting on 06/28/2018 for chest congestion (as per patient cough at night and early morning denies chills or fever onset 2 weeks obtw joint cramp in hands and left leg cramps at night )   HPI   Suspected COPD, acute exacerbation / Sinusitis Reports symptoms started about 2 weeks ago with sinus congestion and pressure, worsening now in past few days with lower respiratory symptoms productive cough and wheezing and dyspnea. - Tried OTC med limited relief - Active smoking. No formal dx COPD in past Admits some lower extremity muscle cramps at night Denies any fevers chills sweats, body aches   Depression screen [facility] 2/9 06/28/2018 12/05/2016 09/26/2016  Decreased Interest 0 0 3  Down, Depressed, Hopeless 0 0 0  PHQ - 2 Score 0 0 3  Altered sleeping - 0 3  Tired, decreased energy - 0 3  Change in appetite - 0 3  Feeling bad or failure about yourself  - 0 0  Trouble concentrating - 0 0  Moving slowly or fidgety/restless - 2 3  Suicidal thoughts - 0 0  PHQ-9 Score - 2 15  Difficult doing work/chores - Not difficult at all -    Social History   Tobacco Use  . Smoking status: Current Every Day Smoker    Packs/day: 1.00    Years: 50.00    Pack years: 50.00  . Smokeless tobacco: Current User  Substance Use Topics  . Alcohol use: Yes    Comment: couple beers weekends  . Drug use: No    Review of Systems Per HPI unless specifically indicated above     Objective:    BP (!) 163/89   Pulse 77   Temp 97.9 F (36.6 C) (Oral)   Resp 16   Ht _0  (1.778 m)   Wt 143 lb (64.9 kg)   SpO2 100%   BMI 20.52 kg/m   Wt Readings from Last 3 Encounters:  06/28/18 143 lb (64.9 kg)  12/05/16 141 lb (64 kg)  09/26/16 138 lb (62.6 kg)    Physical Exam Vitals signs and nursing note reviewed.  Constitutional:      General: He is not in acute distress.  Appearance: He is well-developed. He is not diaphoretic.     Comments: Well-appearing, comfortable, cooperative  HENT:     Head: Normocephalic and atraumatic.     Comments: Hard of hearing.  maxillary sinuses tender. Nares with turbinate edema and congestion without purulence. Bilateral TMs unable to be viewed due to ear canal/helix scar tissue.  Oropharynx clear without erythema, exudates, edema or asymmetry. Eyes:     General:        Right eye: No discharge.        Left eye: No discharge.     Conjunctiva/sclera: Conjunctivae normal.  Neck:     Musculoskeletal: Normal range of motion and neck supple.     Thyroid: No thyromegaly.  Cardiovascular:     Rate and Rhythm: Normal rate and regular rhythm.     Heart sounds: Normal heart sounds. No murmur.  Pulmonary:     Effort: Pulmonary effort is normal. No respiratory distress.     Breath sounds: Normal breath sounds. No wheezing or rales.     Comments: Rhonchi and coarse breath sounds, with some scattered wheezing. Reduced air movement. Coughing. Musculoskeletal: Normal  range of motion.  Lymphadenopathy:     Cervical: No cervical adenopathy.  Skin:    General: Skin is warm and dry.     Findings: No erythema or rash.  Neurological:     Mental Status: He is alert and oriented to person, place, and time.  Psychiatric:        Behavior: Behavior normal.     Comments: Well groomed, good eye contact, normal speech and thoughts    Results for orders placed or performed in visit on 11/15/16  COMPLETE METABOLIC PANEL WITH GFR  Result Value Ref Range   Sodium 140 135 - 146 mmol/L   Potassium 4.1 3.5 - 5.3 mmol/L   Chloride 100 98 - 110 mmol/L   CO2 24 20 - 31 mmol/L   Glucose, Bld 83 65 - 99 mg/dL   BUN 17 7 - 25 mg/dL   Creat 1.40 (H) 0.70 - 1.18 mg/dL   Total Bilirubin 0.6 0.2 - 1.2 mg/dL   Alkaline Phosphatase 54 40 - 115 U/L   AST 16 10 - 35 U/L   ALT 13 9 - 46 U/L   Total Protein 6.6 6.1 - 8.1 g/dL   Albumin 4.3 3.6 - 5.1  g/dL   Calcium 9.7 8.6 - 10.3 mg/dL   GFR, Est African American 56 (L) >=60 mL/min   GFR, Est Non African American 48 (L) >=60 mL/min  Lipid panel  Result Value Ref Range   Cholesterol 211 (H) <200 mg/dL   Triglycerides 62 <150 mg/dL   HDL 88 >40 mg/dL   Total CHOL/HDL Ratio 2.4 <5.0 Ratio   VLDL 12 <30 mg/dL   LDL Cholesterol 111 (H) <100 mg/dL  CBC with Differential/Platelet  Result Value Ref Range   WBC 6.7 3.8 - 10.8 K/uL   RBC 4.33 4.20 - 5.80 MIL/uL   Hemoglobin 12.9 (L) 13.2 - 17.1 g/dL   HCT 38.5 38.5 - 50.0 %   MCV 88.9 80.0 - 100.0 fL   MCH 29.8 27.0 - 33.0 pg   MCHC 33.5 32.0 - 36.0 g/dL   RDW 14.8 11.0 - 15.0 %   Platelets 202 140 - 400 K/uL   MPV 9.6 7.5 - 12.5 fL   Neutro Abs 4,623 1,500 - 7,800 cells/uL   Lymphs Abs 1,474 850 - 3,900 cells/uL   Monocytes Absolute 536 200 - 950 cells/uL   Eosinophils Absolute 67 15 - 500 cells/uL   Basophils Absolute 0 0 - 200 cells/uL   Neutrophils Relative % 69 %   Lymphocytes Relative 22 %   Monocytes Relative 8 %   Eosinophils Relative 1 %   Basophils Relative 0 %   Smear Review Criteria for review not met   Hemoglobin A1c  Result Value Ref Range   Hgb A1c MFr Bld 5.4 <5.7 %   Mean Plasma Glucose 108 mg/dL  PSA, Total with Reflex to PSA, Free  Result Value Ref Range   PSA, Total 3.2 <=4.0 ng/mL  HIV antibody  Result Value Ref Range   HIV 1&2 Ab, 4th Generation NONREACTIVE NONREACTIVE  RPR  Result Value Ref Range   RPR Ser Ql NON REAC NON REAC      Assessment & Plan:   Problem List Items Addressed This Visit    Centrilobular emphysema (HCC)   Relevant Medications   predniSONE (DELTASONE) 50 MG tablet    Other Visit Diagnoses    COPD with acute exacerbation ([place])    -  Primary   Relevant Medications  predniSONE (DELTASONE) 50 MG tablet   Acute non-recurrent maxillary sinusitis       Relevant Medications   predniSONE (DELTASONE) 50 MG tablet   amoxicillin-clavulanate (AUGMENTIN) 875-125 MG tablet        Consistent with acute maxillary sinusitis, likely initially viral URI vs allergic rhinitis component with worsening concern for bacterial infection.  - As smoker, with abnormal lung  Sounds and history seems to have progressed to COPD exacerbation  Plan: 1. Start Augmentin 875-145m PO BID x 10 days 2. Prednisonen 584mdaily x 5 days burst 3. Offered Albuterol, declined 4. Return criteria reviewed   Meds ordered this encounter  Medications  . predniSONE (DELTASONE) 50 MG tablet    Sig: Take 1 tablet (50 mg total) by mouth daily with breakfast.    Dispense:  5 tablet    Refill:  0  . amoxicillin-clavulanate (AUGMENTIN) 875-125 MG tablet    Sig: Take 1 tablet by mouth 2 (two) times daily.    Dispense:  20 tablet    Refill:  0      Follow up plan: Return in about 2 weeks (around 07/12/2018), or if symptoms worsen or fail to improve, for COPD sinus.  AlNobie PutnamDOGraeagleroup 06/28/2018, 2:05 PM

## 2018-06-29 ENCOUNTER — Encounter: Payer: Self-pay | Admitting: Family Medicine

## 2018-09-21 ENCOUNTER — Other Ambulatory Visit: Payer: Self-pay | Admitting: Nurse Practitioner

## 2018-09-21 DIAGNOSIS — I10 Essential (primary) hypertension: Secondary | ICD-10-CM

## 2019-01-27 ENCOUNTER — Telehealth: Payer: Self-pay | Admitting: Family Medicine

## 2019-01-27 NOTE — Chronic Care Management (AMB) (Signed)
°  Chronic Care Management   Outreach Note  01/27/2019 Name: Alexander Rowe MRN: [id number] DOB: December 24, 1940  Referred by: Olin Hauser, DO Reason for referral : Chronic Care Management (Initial CCM outreach was unsuccessful.)   An unsuccessful telephone outreach was attempted today. The patient was referred to the case management team by for assistance with care management and care coordination.   Follow Up Plan: The care management team will reach out to the patient again over the next 7 days.   [place]  [email or web address]   ??[id number]

## 2019-01-28 NOTE — Chronic Care Management (AMB) (Signed)
°  Chronic Care Management   Outreach Note  01/28/2019 Name: Alexander Rowe MRN: [id number] DOB: Aug 31, 1940  Referred by: Olin Hauser, DO Reason for referral : Chronic Care Management (Initial CCM outreach was unsuccessful.) and Chronic Care Management (Second CCM outreach was unsuccessful. )   A second unsuccessful telephone outreach was attempted today. The patient was referred to the case management team for assistance with care management and care coordination.   Follow Up Plan: The care management team will reach out to the patient again over the next 7 days.   [place]  [email or web address]   ??[id number]

## 2019-02-03 NOTE — Chronic Care Management (AMB) (Signed)
°  Chronic Care Management   Outreach Note  02/03/2019 Name: Alexander Rowe MRN: [id number] DOB: 1940-05-09  Referred by: Olin Hauser, DO Reason for referral : Chronic Care Management (Initial CCM outreach was unsuccessful.), Chronic Care Management (Second CCM outreach was unsuccessful. ), and Chronic Care Management (Third CCM outreach was unsuccessful. )   Third unsuccessful telephone outreach was attempted today. The patient was referred to the case management team for assistance with care management and care coordination. The patient's primary care provider has been notified of our unsuccessful attempts to make or maintain contact with the patient. The care management team is pleased to engage with this patient at any time in the future should he/she be interested in assistance from the care management team.   Follow Up Plan: The care management team is available to follow up with the patient after provider conversation with the patient regarding recommendation for care management engagement and subsequent re-referral to the care management team.   [place]  [email or web address]   ??[id number]

## 2019-03-10 ENCOUNTER — Telehealth: Payer: Self-pay | Admitting: Family Medicine

## 2019-03-10 NOTE — Telephone Encounter (Signed)
I called the patient to schedule AWV with Tiffany, but there was no answer and no option to leave a message....line busy

## 2019-06-18 ENCOUNTER — Other Ambulatory Visit: Payer: Self-pay | Admitting: Family Medicine

## 2019-06-18 DIAGNOSIS — I10 Essential (primary) hypertension: Secondary | ICD-10-CM

## 2019-09-16 ENCOUNTER — Other Ambulatory Visit: Payer: Self-pay | Admitting: Family Medicine

## 2019-09-16 DIAGNOSIS — I10 Essential (primary) hypertension: Secondary | ICD-10-CM

## 2019-09-16 NOTE — Telephone Encounter (Signed)
Refill request for Losartan and HCTZ; last refills 06/19/19; no valid encounter within last 12 months; no upcoming appts noted; attempted to contact pt; message states "the umber is no longer in service"; will route to office for final disposition.  Requested medication (s) are due for refill today: Losartan, yes  Requested medication (s) are on the active medication list:yes  Last refill:  06/19/19  Future visit scheduled: no  Notes to clinic: no valid encounter within last 12 months  Requested medication (s) are due for refill today: HCTZ, yes  Requested medication (s) are on the active medication list:yes  Last refill:  06/19/19  Future visit scheduled: no  Notes to clinic: no valid encounter within last 12 months

## 2019-09-19 ENCOUNTER — Other Ambulatory Visit: Payer: Self-pay

## 2019-09-19 ENCOUNTER — Ambulatory Visit (INDEPENDENT_AMBULATORY_CARE_PROVIDER_SITE_OTHER): Payer: Medicare HMO | Admitting: Family Medicine

## 2019-09-19 ENCOUNTER — Encounter: Payer: Self-pay | Admitting: Family Medicine

## 2019-09-19 VITALS — BP 146/76 | HR 70 | Temp 97.5°F | Resp 16 | Ht 70.0 in | Wt 145.8 lb

## 2019-09-19 DIAGNOSIS — R351 Nocturia: Secondary | ICD-10-CM

## 2019-09-19 DIAGNOSIS — J432 Centrilobular emphysema: Secondary | ICD-10-CM | POA: Diagnosis not present

## 2019-09-19 DIAGNOSIS — L602 Onychogryphosis: Secondary | ICD-10-CM

## 2019-09-19 DIAGNOSIS — I10 Essential (primary) hypertension: Secondary | ICD-10-CM | POA: Diagnosis not present

## 2019-09-19 DIAGNOSIS — J3089 Other allergic rhinitis: Secondary | ICD-10-CM

## 2019-09-19 DIAGNOSIS — E782 Mixed hyperlipidemia: Secondary | ICD-10-CM

## 2019-09-19 DIAGNOSIS — R7309 Other abnormal glucose: Secondary | ICD-10-CM

## 2019-09-19 MED ORDER — LOSARTAN POTASSIUM 25 MG PO TABS: 25.0000 mg | ORAL_TABLET | Freq: Every day | ORAL | 3 refills | Status: DC

## 2019-09-19 MED ORDER — HYDROCHLOROTHIAZIDE 25 MG PO TABS: 25.0000 mg | ORAL_TABLET | Freq: Every day | ORAL | 3 refills | Status: DC

## 2019-09-19 MED ORDER — FLUTICASONE PROPIONATE 50 MCG/ACT NA SUSP: 2.0000 | Freq: Every day | NASAL | 3 refills | Status: DC

## 2019-09-19 MED ORDER — ATORVASTATIN CALCIUM 40 MG PO TABS: 40.0000 mg | ORAL_TABLET | Freq: Every morning | ORAL | 3 refills | Status: DC

## 2019-09-19 NOTE — Assessment & Plan Note (Signed)
Elevated BP, off meds, had improved - Home BP readings not available, still not checking Complication with CKD-III - [facility]   Plan:  1. Continue current BP regimen - HCTZ 25mg , Losartan 25mg  - re order 2. Encourage improved lifestyle - low sodium diet, regular exercise 3. Start monitor BP outside office, bring readings to next visit, if persistently >150/90 or new symptoms notify office sooner

## 2019-09-19 NOTE — Assessment & Plan Note (Signed)
Previously controlled Re order Atorvastatin 40mg  Due for lipid, return next week

## 2019-09-19 NOTE — Patient Instructions (Addendum)
Thank you for coming to the office today.  Start nasal steroid Flonase 2 sprays in each nostril daily for 4-6 weeks, may repeat course seasonally or as needed  Refill medications today.  For Toenail - they will call you to schedule  [facility] Address: [address] [place], [place] [address] Hours: Open 8AM-5PM Phone: [phone]  DUE for [place] (no food or drink after midnight before the lab appointment, only water or coffee without cream/sugar on the morning of)  SCHEDULE "Lab Only" visit in the morning at the clinic for lab draw in 1 WEEK  - Make sure Lab Only appointment is at about 1 week before your next appointment, so that results will be available  For Lab Results, once available within 2-3 days of blood draw, you can can log in to MyChart online to view your results and a brief explanation. Also, we can discuss results at next follow-up visit.   Please schedule a Follow-up Appointment to: Return in about 1 week (around 09/26/2019) for HTN COPD.  If you have any other questions or concerns, please feel free to call the office or send a message through [place]. You may also schedule an earlier appointment if necessary.  Additionally, you may be receiving a survey about your experience at our office within a few days to 1 week by e-mail or mail. We value your feedback.  [name], DO [place]

## 2019-09-19 NOTE — Assessment & Plan Note (Signed)
Stable chronic problem Inc phlegm production  Start nasal steroid Flonase 2 sprays in each nostril daily for 4-6 weeks, may repeat course seasonally or as needed Offered daily inhaler for copd maintenance, he declined, future trial Anoro if interested

## 2019-09-19 NOTE — Progress Notes (Signed)
Subjective:    Patient ID: Alexander Rowe, male    DOB: 05/20/1940, 79 y.o.   MRN: [id number]  Alexander Rowe is a 79 y.o. male presenting on 09/19/2019 for Hypertension (btw he thinks he has side effect from vaccine which he had on 03/21)   HPI   Left great toenail overgrown Request toenail trim due inc growth. No pain. No swelling. Needs referral to Podiatry  CHRONIC HTN: Reports due for med refills, out of meds. Last visit >1 year ago Current Meds - Losartan 47m daily, HCTZ 221mdaily   Did not take meds Denies CP, dyspnea, HA, edema, dizziness / lightheadedness  Centrilobular Emphysema (COPD) Admits phlegm and productive cough at times, episodic. Never on maintenance inhaler or therapy  [facility] 09/19/2019 06/28/2018 12/05/2016  Decreased Interest 0 0 0  Down, Depressed, Hopeless 0 0 0  PHQ - 2 Score 0 0 0  Altered sleeping - - 0  Tired, decreased energy - - 0  Change in appetite - - 0  Feeling bad or failure about yourself  - - 0  Trouble concentrating - - 0  Moving slowly or fidgety/restless - - 2  Suicidal thoughts - - 0  PHQ-9 Score - - 2  Difficult doing work/chores - - Not difficult at all    Social History   Tobacco Use  . Smoking status: Current Every Day Smoker    Packs/day: 1.00    Years: 50.00    Pack years: 50.00  . Smokeless tobacco: Current User  Substance Use Topics  . Alcohol use: Yes    Comment: couple beers weekends  . Drug use: No    Review of Systems Per HPI unless specifically indicated above     Objective:    BP (!) 146/76   Pulse 70   Temp (!) 97.5 F (36.4 C) (Temporal)   Resp 16   Ht 5' 10"  (1.778 m)   Wt 145 lb 12.8 oz (66.1 kg)   SpO2 96%   BMI 20.92 kg/m   Wt Readings from Last 3 Encounters:  09/19/19 145 lb 12.8 oz (66.1 kg)  06/28/18 143 lb (64.9 kg)  12/05/16 141 lb (64 kg)    Physical Exam Vitals and nursing note reviewed.  Constitutional:      General: He is not in acute distress.  Appearance: He is well-developed. He is not diaphoretic.     Comments: Well-appearing, comfortable, cooperative, thin  HENT:     Head: Normocephalic and atraumatic.  Eyes:     General:        Right eye: No discharge.        Left eye: No discharge.     Conjunctiva/sclera: Conjunctivae normal.  Neck:     Thyroid: No thyromegaly.  Cardiovascular:     Rate and Rhythm: Normal rate and regular rhythm.     Heart sounds: Normal heart sounds. No murmur.  Pulmonary:     Effort: Pulmonary effort is normal. No respiratory distress.     Breath sounds: Normal breath sounds. No wheezing or rales.     Comments: Mild reduced air movement diffuse Musculoskeletal:        General: Normal range of motion.     Cervical back: Normal range of motion and neck supple.  Lymphadenopathy:     Cervical: No cervical adenopathy.  Skin:    General: Skin is warm and dry.     Findings: Lesion (scattered scar tissue dry skin lesion removal in past  from dermtology extremities and scallp) present. No erythema or rash.  Neurological:     Mental Status: He is alert and oriented to person, place, and time.  Psychiatric:        Behavior: Behavior normal.     Comments: Well groomed, good eye contact, normal speech and thoughts       Results for orders placed or performed in visit on 11/15/16  COMPLETE METABOLIC PANEL WITH GFR  Result Value Ref Range   Sodium 140 135 - 146 mmol/L   Potassium 4.1 3.5 - 5.3 mmol/L   Chloride 100 98 - 110 mmol/L   CO2 24 20 - 31 mmol/L   Glucose, Bld 83 65 - 99 mg/dL   BUN 17 7 - 25 mg/dL   Creat 1.40 (H) 0.70 - 1.18 mg/dL   Total Bilirubin 0.6 0.2 - 1.2 mg/dL   Alkaline Phosphatase 54 40 - 115 U/L   AST 16 10 - 35 U/L   ALT 13 9 - 46 U/L   Total Protein 6.6 6.1 - 8.1 g/dL   Albumin 4.3 3.6 - 5.1 g/dL   Calcium 9.7 8.6 - 10.3 mg/dL   GFR, Est African American 56 (L) >=60 mL/min   GFR, Est Non African American 48 (L) >=60 mL/min  Lipid panel  Result Value Ref Range    Cholesterol 211 (H) <200 mg/dL   Triglycerides 62 <150 mg/dL   HDL 88 >40 mg/dL   Total CHOL/HDL Ratio 2.4 <5.0 Ratio   VLDL 12 <30 mg/dL   LDL Cholesterol 111 (H) <100 mg/dL  CBC with Differential/Platelet  Result Value Ref Range   WBC 6.7 3.8 - 10.8 K/uL   RBC 4.33 4.20 - 5.80 MIL/uL   Hemoglobin 12.9 (L) 13.2 - 17.1 g/dL   HCT 38.5 38.5 - 50.0 %   MCV 88.9 80.0 - 100.0 fL   MCH 29.8 27.0 - 33.0 pg   MCHC 33.5 32.0 - 36.0 g/dL   RDW 14.8 11.0 - 15.0 %   Platelets 202 140 - 400 K/uL   MPV 9.6 7.5 - 12.5 fL   Neutro Abs 4,623 1,500 - 7,800 cells/uL   Lymphs Abs 1,474 850 - 3,900 cells/uL   Monocytes Absolute 536 200 - 950 cells/uL   Eosinophils Absolute 67 15 - 500 cells/uL   Basophils Absolute 0 0 - 200 cells/uL   Neutrophils Relative % 69 %   Lymphocytes Relative 22 %   Monocytes Relative 8 %   Eosinophils Relative 1 %   Basophils Relative 0 %   Smear Review Criteria for review not met   Hemoglobin A1c  Result Value Ref Range   Hgb A1c MFr Bld 5.4 <5.7 %   Mean Plasma Glucose 108 mg/dL  PSA, Total with Reflex to PSA, Free  Result Value Ref Range   PSA, Total 3.2 <=4.0 ng/mL  HIV antibody  Result Value Ref Range   HIV 1&2 Ab, 4th Generation NONREACTIVE NONREACTIVE  RPR  Result Value Ref Range   RPR Ser Ql NON REAC NON REAC      Assessment & Plan:   Problem List Items Addressed This Visit    Hypertension - Primary    Elevated BP, off meds, had improved - Home BP readings not available, still not checking Complication with CKD-III - No longer [facility] by [facility]   Plan:  1. Continue current BP regimen - HCTZ 29m, Losartan 276m- re order 2. Encourage improved lifestyle - low sodium diet, regular exercise  3. Start monitor BP outside office, bring readings to next visit, if persistently >150/90 or new symptoms notify office sooner      Relevant Medications   losartan (COZAAR) 25 MG tablet   hydrochlorothiazide (HYDRODIURIL) 25 MG tablet   atorvastatin  (LIPITOR) 40 MG tablet   Other Relevant Orders   CBC with Differential/Platelet   COMPLETE METABOLIC PANEL WITH GFR   Hyperlipidemia    Previously controlled Re order Atorvastatin 72m Due for lipid, return next week      Relevant Medications   losartan (COZAAR) 25 MG tablet   hydrochlorothiazide (HYDRODIURIL) 25 MG tablet   atorvastatin (LIPITOR) 40 MG tablet   Other Relevant Orders   COMPLETE METABOLIC PANEL WITH GFR   Lipid panel   Centrilobular emphysema (HCC)    Stable chronic problem Inc phlegm production  Start nasal steroid Flonase 2 sprays in each nostril daily for 4-6 weeks, may repeat course seasonally or as needed Offered daily inhaler for copd maintenance, he declined, future trial Anoro if interested      Relevant Medications   fluticasone (FLONASE) 50 MCG/ACT nasal spray    Other Visit Diagnoses    Overgrown toenails       Relevant Orders   Ambulatory referral to Podiatry   Seasonal allergic rhinitis due to other allergic trigger       Relevant Medications   fluticasone (FLONASE) 50 MCG/ACT nasal spray   Abnormal glucose       Relevant Orders   Hemoglobin A1c   Nocturia       Relevant Orders   PSA       Orders Placed This Encounter  Procedures  . Hemoglobin A1c    Standing Status:   Future    Standing Expiration Date:   03/24/2020  . CBC with Differential/Platelet    Standing Status:   Future    Standing Expiration Date:   03/24/2020  . COMPLETE METABOLIC PANEL WITH GFR    Standing Status:   Future    Standing Expiration Date:   03/24/2020  . Lipid panel    Standing Status:   Future    Standing Expiration Date:   03/24/2020    Order Specific Question:   Has the patient fasted?    Answer:   Yes  . PSA    Standing Status:   Future    Standing Expiration Date:   03/24/2020  . Ambulatory referral to Podiatry    Referral Priority:   Routine    Referral Type:   Consultation    Referral Reason:   Specialty Services Required    Requested Specialty:    Podiatry    Number of Visits Requested:   1     Meds ordered this encounter  Medications  . losartan (COZAAR) 25 MG tablet    Sig: Take 1 tablet (25 mg total) by mouth daily.    Dispense:  90 tablet    Refill:  3  . hydrochlorothiazide (HYDRODIURIL) 25 MG tablet    Sig: Take 1 tablet (25 mg total) by mouth daily.    Dispense:  90 tablet    Refill:  3  . atorvastatin (LIPITOR) 40 MG tablet    Sig: Take 1 tablet (40 mg total) by mouth every morning.    Dispense:  90 tablet    Refill:  3  . fluticasone (FLONASE) 50 MCG/ACT nasal spray    Sig: Place 2 sprays into both nostrils daily. Use for 4-6 weeks then stop and use seasonally  or as needed.    Dispense:  16 g    Refill:  3     Follow up plan: Return in about 1 week (around 09/26/2019) for HTN COPD.  Future labs ordered for 09/25/19, return for lab   [name], [facility] 09/19/2019, 11:47 AM

## 2019-09-25 ENCOUNTER — Telehealth: Payer: Self-pay

## 2019-09-25 DIAGNOSIS — Z8589 Personal history of malignant neoplasm of other organs and systems: Secondary | ICD-10-CM

## 2019-09-25 DIAGNOSIS — L989 Disorder of the skin and subcutaneous tissue, unspecified: Secondary | ICD-10-CM

## 2019-09-25 NOTE — Telephone Encounter (Signed)
I just reviewed his chart. He was seen on 5/28. He does not need apt tomorrow for a biopsy on his neck. He was asked to return to his Dermatologist. I do recall exactly what the spot on his neck looks like and it is not something I can treat here in office. He will need a new referral to Dermatologist if he doesn't have one already. He was asked to get lab only on 6/3 and then return the following week for Blood Pressure and Lab Review.  [name], DO [facility] 09/25/2019, 11:58 AM

## 2019-09-25 NOTE — Addendum Note (Signed)
Addended by: Olin Hauser on: 09/25/2019 05:16 PM   Modules accepted: Orders

## 2019-09-25 NOTE — Telephone Encounter (Signed)
Referral placed to [facility] where he was seen in 2019 for prior skin cancer.  If he prefers to see OTHER Dermatology locally instead, he can notify us. Otherwise, I would recommend return to same Dermatologist at [facility] 09/25/2019, 5:16 PM

## 2019-09-25 NOTE — Telephone Encounter (Signed)
Copied from [place] [id number]. Topic: General - Other >> Sep 24, 2019  4:42 PM Wynetta Emery, [place] C wrote: Reason for CRM: pt called in to request to have a biopsy completed on the right side of his neck. Pt says that his dermatologist would like to have completed due to skin cancer. Pt would like to have set up as soon as possible.    [phone]  --

## 2019-09-25 NOTE — Telephone Encounter (Signed)
Left message for patient to call back he does not need to keep tomorrow's apt and see other reply from the provider.

## 2019-09-26 ENCOUNTER — Other Ambulatory Visit: Payer: Self-pay

## 2019-09-26 ENCOUNTER — Ambulatory Visit: Payer: Medicare HMO | Admitting: Family Medicine

## 2019-09-26 ENCOUNTER — Other Ambulatory Visit: Payer: Medicare HMO

## 2019-09-26 DIAGNOSIS — E782 Mixed hyperlipidemia: Secondary | ICD-10-CM | POA: Diagnosis not present

## 2019-09-26 DIAGNOSIS — R7309 Other abnormal glucose: Secondary | ICD-10-CM | POA: Diagnosis not present

## 2019-09-26 DIAGNOSIS — I10 Essential (primary) hypertension: Secondary | ICD-10-CM | POA: Diagnosis not present

## 2019-09-26 DIAGNOSIS — R351 Nocturia: Secondary | ICD-10-CM

## 2019-09-26 NOTE — Telephone Encounter (Signed)
Patient was getting lab work done today explained about dermatology referral and apt is scheduled with Dr.K to go over test result.

## 2019-09-27 LAB — LIPID PANEL
Cholesterol: 160 mg/dL (ref <200)
HDL: 65 mg/dL (ref >=40)
LDL Cholesterol (Calc): 76 mg/dL (calc)
Non-HDL Cholesterol (Calc): 95 mg/dL (calc) (ref <130)
Total CHOL/HDL Ratio: 2.5 (calc) (ref <5.0)
Triglycerides: 108 mg/dL (ref <150)

## 2019-09-27 LAB — COMPLETE METABOLIC PANEL WITH GFR
AG Ratio: 2 (calc) (ref 1.0–2.5)
ALT: 14 U/L (ref 9–46)
AST: 19 U/L (ref 10–35)
Albumin: 4.4 g/dL (ref 3.6–5.1)
Alkaline phosphatase (APISO): 50 U/L (ref 35–144)
BUN/Creatinine Ratio: 13 (calc) (ref 6–22)
BUN: 17 mg/dL (ref 7–25)
CO2: 25 mmol/L (ref 20–32)
Calcium: 9.7 mg/dL (ref 8.6–10.3)
Chloride: 94 mmol/L — ABNORMAL LOW (ref 98–110)
Creat: 1.29 mg/dL — ABNORMAL HIGH (ref 0.70–1.11)
GFR, Est African American: 60 mL/min/{1.73_m2} (ref >=60)
GFR, Est Non African American: 52 mL/min/{1.73_m2} — ABNORMAL LOW (ref >=60)
Globulin: 2.2 g/dL (calc) (ref 1.9–3.7)
Glucose, Bld: 93 mg/dL (ref 65–99)
Potassium: 3.8 mmol/L (ref 3.5–5.3)
Sodium: 130 mmol/L — ABNORMAL LOW (ref 135–146)
Total Bilirubin: 1 mg/dL (ref 0.2–1.2)
Total Protein: 6.6 g/dL (ref 6.1–8.1)

## 2019-09-27 LAB — PSA: PSA: 5 ng/mL — ABNORMAL HIGH (ref <=4.0)

## 2019-09-27 LAB — CBC WITH DIFFERENTIAL/PLATELET
Absolute Monocytes: 594 cells/uL (ref 200–950)
Basophils Absolute: 28 cells/uL (ref 0–200)
Basophils Relative: 0.5 %
Eosinophils Absolute: 77 cells/uL (ref 15–500)
Eosinophils Relative: 1.4 %
HCT: 39.1 % (ref 38.5–50.0)
Hemoglobin: 13.2 g/dL (ref 13.2–17.1)
Lymphs Abs: 1392 cells/uL (ref 850–3900)
MCH: 30.2 pg (ref 27.0–33.0)
MCHC: 33.8 g/dL (ref 32.0–36.0)
MCV: 89.5 fL (ref 80.0–100.0)
MPV: 9.4 fL (ref 7.5–12.5)
Monocytes Relative: 10.8 %
Neutro Abs: 3410 cells/uL (ref 1500–7800)
Neutrophils Relative %: 62 %
Platelets: 201 10*3/uL (ref 140–400)
RBC: 4.37 10*6/uL (ref 4.20–5.80)
RDW: 13.2 % (ref 11.0–15.0)
Total Lymphocyte: 25.3 %
WBC: 5.5 10*3/uL (ref 3.8–10.8)

## 2019-09-27 LAB — HEMOGLOBIN A1C
Hgb A1c MFr Bld: 5.2 % of total Hgb (ref <5.7)
Mean Plasma Glucose: 103 (calc)
eAG (mmol/L): 5.7 (calc)

## 2019-09-30 ENCOUNTER — Encounter: Payer: Self-pay | Admitting: Family Medicine

## 2019-09-30 ENCOUNTER — Other Ambulatory Visit: Payer: Self-pay

## 2019-09-30 ENCOUNTER — Ambulatory Visit (INDEPENDENT_AMBULATORY_CARE_PROVIDER_SITE_OTHER): Payer: Medicare HMO | Admitting: Family Medicine

## 2019-09-30 VITALS — BP 138/84 | HR 67 | Temp 97.5°F | Resp 16 | Ht 70.0 in | Wt 147.0 lb

## 2019-09-30 DIAGNOSIS — N183 Chronic kidney disease, stage 3 unspecified: Secondary | ICD-10-CM

## 2019-09-30 DIAGNOSIS — N1831 Chronic kidney disease, stage 3a: Secondary | ICD-10-CM

## 2019-09-30 DIAGNOSIS — Z8589 Personal history of malignant neoplasm of other organs and systems: Secondary | ICD-10-CM | POA: Diagnosis not present

## 2019-09-30 DIAGNOSIS — R972 Elevated prostate specific antigen [PSA]: Secondary | ICD-10-CM | POA: Insufficient documentation

## 2019-09-30 DIAGNOSIS — L989 Disorder of the skin and subcutaneous tissue, unspecified: Secondary | ICD-10-CM | POA: Diagnosis not present

## 2019-09-30 DIAGNOSIS — I129 Hypertensive chronic kidney disease with stage 1 through stage 4 chronic kidney disease, or unspecified chronic kidney disease: Secondary | ICD-10-CM | POA: Diagnosis not present

## 2019-09-30 DIAGNOSIS — H9113 Presbycusis, bilateral: Secondary | ICD-10-CM | POA: Diagnosis not present

## 2019-09-30 DIAGNOSIS — E782 Mixed hyperlipidemia: Secondary | ICD-10-CM | POA: Diagnosis not present

## 2019-09-30 NOTE — Assessment & Plan Note (Signed)
Elevated BP again but improved. Repeat manual improved - Home BP readings reviewed, infrequent check Complication with CKD-III - [facility] longer [facility]    Plan:  1. Continue current BP regimen - HCTZ 25mg , Losartan 25mg  - Discussed importance of improved water intake, he has very limited water intake now, avoid soda and inc water 2. Encourage improved lifestyle - low sodium diet, regular exercise 3. Start monitor BP outside office, bring readings to next visit, if persistently >150/90 or new symptoms notify office sooner

## 2019-09-30 NOTE — Progress Notes (Signed)
Subjective:    Patient ID: Alexander Rowe, male    DOB: 20-Feb-1941, 79 y.o.   MRN: [id number]  Alexander Rowe is a 79 y.o. male presenting on 09/30/2019 for Hypertension, COPD, and Gastroesophageal Reflux   HPI   CHRONIC HTN CKD III Back on medications. Current Meds - Losartan 25mg  daily, HCTZ 25mg  daily   Did not take meds Denies CP, dyspnea, HA, edema, dizziness / lightheadedness  Centrilobular Emphysema (COPD) Not ready to quit smoking.   HYPERLIPIDEMIA: - Reports no concerns. Last lipid panel 09/2019, improved result - he is no longer on atorvastatin  Elevated PSA 5.0 (09/2019) no prior readings. He has admit nocturia 1-2x nightly, has some urgency otherwise has strong urinary stream. No prior history of BPH.  Left arm paresthesia / Shoulder pain history arthritis Tingling nerve sensation  Chronic hearing loss - prior surgery  Depression screen [facility] 2/9 09/19/2019 06/28/2018 12/05/2016  Decreased Interest 0 0 0  Down, Depressed, Hopeless 0 0 0  PHQ - 2 Score 0 0 0  Altered sleeping - - 0  Tired, decreased energy - - 0  Change in appetite - - 0  Feeling bad or failure about yourself  - - 0  Trouble concentrating - - 0  Moving slowly or fidgety/restless - - 2  Suicidal thoughts - - 0  PHQ-9 Score - - 2  Difficult doing work/chores - - Not difficult at all    Social History   Tobacco Use  . Smoking status: Current Every Day Smoker    Packs/day: 1.00    Years: 50.00    Pack years: 50.00  . Smokeless tobacco: Current User  Substance Use Topics  . Alcohol use: Yes    Comment: couple beers weekends  . Drug use: No    Review of Systems Per HPI unless specifically indicated above     Objective:    BP 138/84 (BP Location: Left Arm, Cuff Size: Normal)   Pulse 67   Temp (!) 97.5 F (36.4 C) (Temporal)   Resp 16   Ht 5\' 10"  (1.778 m)   Wt 147 lb (66.7 kg)   SpO2 99%   BMI 21.09 kg/m   Wt Readings from Last 3 Encounters:  09/30/19 147 lb (66.7 kg)  09/19/19  145 lb 12.8 oz (66.1 kg)  06/28/18 143 lb (64.9 kg)    Physical Exam Vitals and nursing note reviewed.  Constitutional:      General: He is not in acute distress.    Appearance: He is well-developed. He is not diaphoretic.     Comments: Well-appearing, comfortable, cooperative  HENT:     Head: Normocephalic and atraumatic.     Ears:     Comments: Unable to successfully see TMs due to narrowing and scar tissue of external ear canals. No obvious cerumen impaction on my exam but it is limited  Hard of hearing. Better at lip reading. Eyes:     General:        Right eye: No discharge.        Left eye: No discharge.     Conjunctiva/sclera: Conjunctivae normal.  Neck:     Thyroid: No thyromegaly.  Cardiovascular:     Rate and Rhythm: Normal rate and regular rhythm.     Heart sounds: Normal heart sounds. No murmur.  Pulmonary:     Effort: Pulmonary effort is normal. No respiratory distress.     Breath sounds: Normal breath sounds. No wheezing or rales.  Musculoskeletal:  General: Normal range of motion.     Cervical back: Normal range of motion and neck supple.  Lymphadenopathy:     Cervical: No cervical adenopathy.  Skin:    General: Skin is warm and dry.     Findings: Lesion (R side of lateral neck with non healing skin lesion ulcerated) present. No erythema or rash.     Comments: Chronic dry skin with multiple areas of scar tissue prior skin damage  Neurological:     Mental Status: He is alert and oriented to person, place, and time.  Psychiatric:        Behavior: Behavior normal.     Comments: Well groomed, good eye contact, normal speech and thoughts        Results for orders placed or performed in visit on 09/26/19  PSA  Result Value Ref Range   PSA 5.0 (H) < OR = 4.0 ng/mL  Lipid panel  Result Value Ref Range   Cholesterol 160 <200 mg/dL   HDL 65 > OR = 40 mg/dL   Triglycerides 108 <150 mg/dL   LDL Cholesterol (Calc) 76 mg/dL (calc)   Total CHOL/HDL Ratio  2.5 <5.0 (calc)   Non-HDL Cholesterol (Calc) 95 <130 mg/dL (calc)  COMPLETE METABOLIC PANEL WITH GFR  Result Value Ref Range   Glucose, Bld 93 65 - 99 mg/dL   BUN 17 7 - 25 mg/dL   Creat 1.29 (H) 0.70 - 1.11 mg/dL   GFR, Est Non African American 52 (L) > OR = 60 mL/min/1.64m2   GFR, Est African American 60 > OR = 60 mL/min/1.19m2   BUN/Creatinine Ratio 13 6 - 22 (calc)   Sodium 130 (L) 135 - 146 mmol/L   Potassium 3.8 3.5 - 5.3 mmol/L   Chloride 94 (L) 98 - 110 mmol/L   CO2 25 20 - 32 mmol/L   Calcium 9.7 8.6 - 10.3 mg/dL   Total Protein 6.6 6.1 - 8.1 g/dL   Albumin 4.4 3.6 - 5.1 g/dL   Globulin 2.2 1.9 - 3.7 g/dL (calc)   AG Ratio 2.0 1.0 - 2.5 (calc)   Total Bilirubin 1.0 0.2 - 1.2 mg/dL   Alkaline phosphatase (APISO) 50 35 - 144 U/L   AST 19 10 - 35 U/L   ALT 14 9 - 46 U/L  CBC with Differential/Platelet  Result Value Ref Range   WBC 5.5 3.8 - 10.8 Thousand/uL   RBC 4.37 4.20 - 5.80 Million/uL   Hemoglobin 13.2 13.2 - 17.1 g/dL   HCT 39.1 38.5 - 50.0 %   MCV 89.5 80.0 - 100.0 fL   MCH 30.2 27.0 - 33.0 pg   MCHC 33.8 32.0 - 36.0 g/dL   RDW 13.2 11.0 - 15.0 %   Platelets 201 140 - 400 Thousand/uL   MPV 9.4 7.5 - 12.5 fL   Neutro Abs 3,410 1,500 - 7,800 cells/uL   Lymphs Abs 1,392 850 - 3,900 cells/uL   Absolute Monocytes 594 200 - 950 cells/uL   Eosinophils Absolute 77 15 - 500 cells/uL   Basophils Absolute 28 0 - 200 cells/uL   Neutrophils Relative % 62 %   Total Lymphocyte 25.3 %   Monocytes Relative 10.8 %   Eosinophils Relative 1.4 %   Basophils Relative 0.5 %  Hemoglobin A1c  Result Value Ref Range   Hgb A1c MFr Bld 5.2 <5.7 % of total Hgb   Mean Plasma Glucose 103 (calc)   eAG (mmol/L) 5.7 (calc)      Assessment &  Plan:   Problem List Items Addressed This Visit    Presbycusis of both ears   Hyperlipidemia   Elevated PSA, less than 10 ng/ml    Elevated PSA at 5.0 (09/2019) No prior comparison result No known fam history Asymptomatic except mild  urgency and nocturia, very minimal LUTS.  Plan Repeat PSA in 3 months, for trend. If >5 still then will refer to urology for consultation. If < 5 and improved will discuss treatment options for BPH      Relevant Orders   PSA   CKD (chronic kidney disease), stage III    Improved Cr to 1.2 CKD-III Secondary HTN and age, and poor hydration See A&P HTN      Relevant Orders   BASIC METABOLIC PANEL WITH GFR   Benign hypertension with CKD (chronic kidney disease) stage III - Primary    Elevated BP again but improved. Repeat manual improved - Home BP readings reviewed, infrequent check Complication with CKD-III - [facility]    Plan:  1. Continue current BP regimen - HCTZ 25mg , Losartan 25mg  - Discussed importance of improved water intake, he has very limited water intake now, avoid soda and inc water 2. Encourage improved lifestyle - low sodium diet, regular exercise 3. Start monitor BP outside office, bring readings to next visit, if persistently >150/90 or new symptoms notify office sooner      Relevant Orders   BASIC METABOLIC PANEL WITH GFR    Other Visit Diagnoses    Non-healing skin lesion       History of squamous cell carcinoma          Advised him to CALL his previous [facility] D[facility] Dr Manley Mason - # and contact info given to evaluate this R neck lesion non healing, high risk for skin cancer given his history - he should schedule follow-up with Derm now.  #HLD Restart Statin.  No orders of the defined types were placed in this encounter.    Follow up plan: Return in about 3 months (around 12/31/2019) for Elevated PSA, HTN.  Future labs ordered for 3 months BMET + PSA   [name], DO [facility] 09/30/2019, 1:31 PM

## 2019-09-30 NOTE — Patient Instructions (Addendum)
Thank you for coming to the office today.  Go ahead and call your Dermatologist to see if they can schedule you.  [address]  [id number], [place], [place] [address]  Phone: [phone]   Increase water intake  Keep track of BP and take medications.  Elevated PSA 5.0 (prostate) we need to re-check this again in 3 months to see if it is increasing and if it is - then we can refer to Urologist to make sure it isnt prostate cancer.  Lab in 3 months.  Please schedule a Follow-up Appointment to: Return in about 3 months (around 12/31/2019) for Elevated PSA, HTN.  If you have any other questions or concerns, please feel free to call the office or send a message through [place]. You may also schedule an earlier appointment if necessary.  Additionally, you may be receiving a survey about your experience at our office within a few days to 1 week by e-mail or mail. We value your feedback.  [name], DO [place]

## 2019-09-30 NOTE — Assessment & Plan Note (Signed)
Improved Cr to 1.2 CKD-III Secondary HTN and age, and poor hydration See A&P HTN

## 2019-09-30 NOTE — Assessment & Plan Note (Addendum)
Elevated PSA at 5.0 (09/2019) No prior comparison result No known fam history Asymptomatic except mild urgency and nocturia, very minimal LUTS.  Plan Repeat PSA in 3 months, for trend. If >5 still then will refer to urology for consultation. If < 5 and improved will discuss treatment options for BPH

## 2019-10-03 DIAGNOSIS — C44319 Basal cell carcinoma of skin of other parts of face: Secondary | ICD-10-CM | POA: Diagnosis not present

## 2019-10-03 DIAGNOSIS — D492 Neoplasm of unspecified behavior of bone, soft tissue, and skin: Secondary | ICD-10-CM | POA: Diagnosis not present

## 2019-10-03 DIAGNOSIS — Z85828 Personal history of other malignant neoplasm of skin: Secondary | ICD-10-CM | POA: Diagnosis not present

## 2019-10-03 DIAGNOSIS — C44612 Basal cell carcinoma of skin of right upper limb, including shoulder: Secondary | ICD-10-CM | POA: Diagnosis not present

## 2019-10-03 DIAGNOSIS — L570 Actinic keratosis: Secondary | ICD-10-CM | POA: Diagnosis not present

## 2019-10-03 DIAGNOSIS — L578 Other skin changes due to chronic exposure to nonionizing radiation: Secondary | ICD-10-CM | POA: Diagnosis not present

## 2019-10-03 DIAGNOSIS — C4441 Basal cell carcinoma of skin of scalp and neck: Secondary | ICD-10-CM | POA: Diagnosis not present

## 2019-11-25 ENCOUNTER — Ambulatory Visit (INDEPENDENT_AMBULATORY_CARE_PROVIDER_SITE_OTHER): Payer: Medicare Other | Admitting: Podiatry

## 2019-11-25 ENCOUNTER — Other Ambulatory Visit: Payer: Self-pay

## 2019-11-25 ENCOUNTER — Encounter: Payer: Self-pay | Admitting: Podiatry

## 2019-11-25 DIAGNOSIS — B351 Tinea unguium: Secondary | ICD-10-CM | POA: Diagnosis not present

## 2019-11-25 DIAGNOSIS — M79675 Pain in left toe(s): Secondary | ICD-10-CM

## 2019-11-25 NOTE — Progress Notes (Signed)
   SUBJECTIVE Patient presents to office today complaining of an elongated, thickened nail to the left hallux that cause pain while ambulating in shoes.  He is unable to trim the nail.  He states that the nail has slowly gotten thicker and is now growing sideways.  It is symptomatic with close toed shoes.  Patient is here for further evaluation and treatment.  Past Medical History:  Diagnosis Date  . Arthritis   . Cancer ([place])    skin  . Hypertension     OBJECTIVE General Patient is awake, alert, and oriented x 3 and in no acute distress. Derm Skin is dry and supple bilateral. Negative open lesions or macerations. Remaining integument unremarkable. Nails are tender, long, thickened and dystrophic with subungual debris, consistent with onychomycosis, left hallux. No signs of infection noted. Vasc  DP and PT pedal pulses palpable bilaterally. Temperature gradient within normal limits.  Neuro Epicritic and protective threshold sensation grossly intact bilaterally.  Musculoskeletal Exam No symptomatic pedal deformities noted bilateral. Muscular strength within normal limits.  ASSESSMENT 1.  Pain due to onychomycosis of toenail left hallux  PLAN OF CARE 1. Patient evaluated today.  Explained to the patient that this is likely a combination of toenail fungus and microtrauma that is consistently been going on for several years. 2. Instructed to maintain good pedal hygiene and foot care.  3. Mechanical debridement of the left hallux nail plate was performed using a nail nipper. Filed with dremel without incident.  4. Return to clinic in 3 mos.    Edrick Kins, DPM [facility]  Dr. Edrick Kins, [address]                                        [place], [place] [address]                Office [phone]  Fax [phone]

## 2019-12-08 DIAGNOSIS — L988 Other specified disorders of the skin and subcutaneous tissue: Secondary | ICD-10-CM | POA: Diagnosis not present

## 2019-12-08 DIAGNOSIS — Z85828 Personal history of other malignant neoplasm of skin: Secondary | ICD-10-CM | POA: Diagnosis not present

## 2019-12-08 DIAGNOSIS — C44319 Basal cell carcinoma of skin of other parts of face: Secondary | ICD-10-CM | POA: Diagnosis not present

## 2019-12-08 DIAGNOSIS — L578 Other skin changes due to chronic exposure to nonionizing radiation: Secondary | ICD-10-CM | POA: Diagnosis not present

## 2019-12-08 DIAGNOSIS — L905 Scar conditions and fibrosis of skin: Secondary | ICD-10-CM | POA: Diagnosis not present

## 2019-12-24 ENCOUNTER — Other Ambulatory Visit: Payer: Self-pay | Admitting: *Deleted

## 2019-12-24 DIAGNOSIS — I129 Hypertensive chronic kidney disease with stage 1 through stage 4 chronic kidney disease, or unspecified chronic kidney disease: Secondary | ICD-10-CM

## 2019-12-24 DIAGNOSIS — N1831 Chronic kidney disease, stage 3a: Secondary | ICD-10-CM

## 2019-12-24 DIAGNOSIS — N183 Chronic kidney disease, stage 3 unspecified: Secondary | ICD-10-CM

## 2019-12-24 DIAGNOSIS — R972 Elevated prostate specific antigen [PSA]: Secondary | ICD-10-CM

## 2019-12-25 ENCOUNTER — Other Ambulatory Visit: Payer: Medicare HMO

## 2019-12-25 ENCOUNTER — Other Ambulatory Visit: Payer: Self-pay

## 2019-12-25 DIAGNOSIS — I129 Hypertensive chronic kidney disease with stage 1 through stage 4 chronic kidney disease, or unspecified chronic kidney disease: Secondary | ICD-10-CM | POA: Diagnosis not present

## 2019-12-25 DIAGNOSIS — N1831 Chronic kidney disease, stage 3a: Secondary | ICD-10-CM | POA: Diagnosis not present

## 2019-12-25 DIAGNOSIS — N183 Chronic kidney disease, stage 3 unspecified: Secondary | ICD-10-CM | POA: Diagnosis not present

## 2019-12-26 LAB — BASIC METABOLIC PANEL WITH GFR
BUN/Creatinine Ratio: 11 (calc) (ref 6–22)
BUN: 16 mg/dL (ref 7–25)
CO2: 31 mmol/L (ref 20–32)
Calcium: 10.1 mg/dL (ref 8.6–10.3)
Chloride: 95 mmol/L — ABNORMAL LOW (ref 98–110)
Creat: 1.51 mg/dL — ABNORMAL HIGH (ref 0.70–1.11)
GFR, Est African American: 50 mL/min/{1.73_m2} — ABNORMAL LOW (ref >=60)
GFR, Est Non African American: 43 mL/min/{1.73_m2} — ABNORMAL LOW (ref >=60)
Glucose, Bld: 61 mg/dL — ABNORMAL LOW (ref 65–99)
Potassium: 4 mmol/L (ref 3.5–5.3)
Sodium: 133 mmol/L — ABNORMAL LOW (ref 135–146)

## 2019-12-26 LAB — PSA: PSA: 3.3 ng/mL (ref <=4.0)

## 2019-12-31 ENCOUNTER — Other Ambulatory Visit: Payer: Self-pay

## 2019-12-31 ENCOUNTER — Encounter: Payer: Self-pay | Admitting: Family Medicine

## 2019-12-31 ENCOUNTER — Ambulatory Visit (INDEPENDENT_AMBULATORY_CARE_PROVIDER_SITE_OTHER): Payer: Medicare Other | Admitting: Family Medicine

## 2019-12-31 VITALS — BP 138/74 | HR 64 | Temp 97.5°F | Resp 16 | Ht 70.0 in | Wt 148.5 lb

## 2019-12-31 DIAGNOSIS — N1831 Chronic kidney disease, stage 3a: Secondary | ICD-10-CM

## 2019-12-31 DIAGNOSIS — I129 Hypertensive chronic kidney disease with stage 1 through stage 4 chronic kidney disease, or unspecified chronic kidney disease: Secondary | ICD-10-CM | POA: Diagnosis not present

## 2019-12-31 DIAGNOSIS — Z23 Encounter for immunization: Secondary | ICD-10-CM

## 2019-12-31 DIAGNOSIS — N183 Chronic kidney disease, stage 3 unspecified: Secondary | ICD-10-CM | POA: Diagnosis not present

## 2019-12-31 DIAGNOSIS — R972 Elevated prostate specific antigen [PSA]: Secondary | ICD-10-CM

## 2019-12-31 NOTE — Assessment & Plan Note (Addendum)
Elevated BP again but improved. Repeat manual improved - Home BP readings reviewed, infrequent check Complication with CKD-III - [facility]   Lab results show persistent elevated Creatinine, CKD III  Plan:  1. Continue current BP regimen - HCTZ 25mg , Losartan 25mg  - Discussed importance of improved water intake, he has very limited water intake now, avoid soda and inc water 2. Encourage improved lifestyle - low sodium diet, regular exercise 3. Keep monitor BP outside office, bring readings to next visit, if persistently >150/90 or new symptoms notify office sooner

## 2019-12-31 NOTE — Assessment & Plan Note (Signed)
Improved PSA to 3.3 (11/2019) previously up to 5.0 last check  No prior comparison result No known fam history Asymptomatic except mild urgency and nocturia, very minimal LUTS.  Plan Trend PSA q 6-12 months, next check in 2022, can offer at next visit in 4 months or defer to next yearly blood draw 09/2020 Again if >5 still then will refer to urology for consultation. If < 5 and improved will discuss treatment options for BPH

## 2019-12-31 NOTE — Assessment & Plan Note (Signed)
Elevated Cr up to 1.5 CKD-III Secondary HTN and age, and poor hydration See A&P HTN

## 2019-12-31 NOTE — Progress Notes (Signed)
Subjective:    Patient ID: Alexander Rowe, male    DOB: December 15, 1940, 79 y.o.   MRN: [id number]  Alexander Rowe is a 79 y.o. male presenting on 12/31/2019 for Abdominal Pain (RLQ--as per patient not now but when putting socks on gets really worst ) and Hypertension   HPI   Right Groin Pain / Possible Hernia Reports episodic worsening episodes, describes some herniation and bulging occasionally worse if he raises his R leg up to put his sock on. Now it is improved but still has some residual symptoms. - No prior hernia - Admits some heavy furniture lifting lately - Denies constipation straining, coughing  CHRONIC HTN CKD III Labs reviewed, still elevated creatinine Home BP readings done. Current Meds -Losartan 25mg  daily, HCTZ 25mg  daily  Denies CP, dyspnea, HA, edema, dizziness / lightheadedness  Elevated PSA 5.0 (09/2019) no prior readings. He has admit nocturia 1-2x nightly, has some urgency otherwise has strong urinary stream. No prior history of BPH. Last lab now showed PSA down to 3.3 (11/2019)  Health Maintenance: Due for Flu Shot, will receive today    Depression screen [facility] 2/9 12/31/2019 09/19/2019 06/28/2018  Decreased Interest 0 0 0  Down, Depressed, Hopeless 0 0 0  PHQ - 2 Score 0 0 0  Altered sleeping - - -  Tired, decreased energy - - -  Change in appetite - - -  Feeling bad or failure about yourself  - - -  Trouble concentrating - - -  Moving slowly or fidgety/restless - - -  Suicidal thoughts - - -  PHQ-9 Score - - -  Difficult doing work/chores - - -    Social History   Tobacco Use  . Smoking status: Current Every Day Smoker    Packs/day: 1.00    Years: 50.00    Pack years: 50.00  . Smokeless tobacco: Current User  Vaping Use  . Vaping Use: Never used  Substance Use Topics  . Alcohol use: Yes    Comment: couple beers weekends  . Drug use: No    Review of Systems Per HPI unless specifically indicated above     Objective:    BP 138/74 (BP  Location: Left Arm, Cuff Size: Normal)   Pulse 64   Temp (!) 97.5 F (36.4 C) (Temporal)   Resp 16   Ht 5\' 10"  (1.778 m)   Wt 148 lb 8 oz (67.4 kg)   SpO2 99%   BMI 21.31 kg/m   Wt Readings from Last 3 Encounters:  12/31/19 148 lb 8 oz (67.4 kg)  09/30/19 147 lb (66.7 kg)  09/19/19 145 lb 12.8 oz (66.1 kg)    Physical Exam Vitals and nursing note reviewed.  Constitutional:      General: He is not in acute distress.    Appearance: He is well-developed. He is not diaphoretic.     Comments: Well-appearing, comfortable, cooperative  HENT:     Head: Normocephalic and atraumatic.     Comments: Hard of hearing bilateral Eyes:     General:        Right eye: No discharge.        Left eye: No discharge.     Conjunctiva/sclera: Conjunctivae normal.  Cardiovascular:     Rate and Rhythm: Normal rate.  Pulmonary:     Effort: Pulmonary effort is normal.  Abdominal:     Hernia: A hernia is present. Hernia is present in the right inguinal area (mild localized defect without significant herniation  today provoked).  Skin:    General: Skin is warm and dry.     Findings: No erythema or rash.  Neurological:     Mental Status: He is alert and oriented to person, place, and time.  Psychiatric:        Behavior: Behavior normal.     Comments: Well groomed, good eye contact, normal speech and thoughts    Results for orders placed or performed in visit on 60/10/93  BASIC METABOLIC PANEL WITH GFR  Result Value Ref Range   Glucose, Bld 61 (L) 65 - 99 mg/dL   BUN 16 7 - 25 mg/dL   Creat 1.51 (H) 0.70 - 1.11 mg/dL   GFR, Est Non African American 43 (L) > OR = 60 mL/min/1.54m2   GFR, Est African American 50 (L) > OR = 60 mL/min/1.30m2   BUN/Creatinine Ratio 11 6 - 22 (calc)   Sodium 133 (L) 135 - 146 mmol/L   Potassium 4.0 3.5 - 5.3 mmol/L   Chloride 95 (L) 98 - 110 mmol/L   CO2 31 20 - 32 mmol/L   Calcium 10.1 8.6 - 10.3 mg/dL  PSA  Result Value Ref Range   PSA 3.3 < OR = 4.0 ng/mL       Assessment & Plan:   Problem List Items Addressed This Visit    Elevated PSA, less than 10 ng/ml - Primary    Improved PSA to 3.3 (11/2019) previously up to 5.0 last check  No prior comparison result No known fam history Asymptomatic except mild urgency and nocturia, very minimal LUTS.  Plan Trend PSA q 6-12 months, next check in 2022, can offer at next visit in 4 months or defer to next yearly blood draw 09/2020 Again if >5 still then will refer to urology for consultation. If < 5 and improved will discuss treatment options for BPH      CKD (chronic kidney disease), stage III    Elevated Cr up to 1.5 CKD-III Secondary HTN and age, and poor hydration See A&P HTN      Benign hypertension with CKD (chronic kidney disease) stage III    Elevated BP again but improved. Repeat manual improved - Home BP readings reviewed, infrequent check Complication with CKD-III - [facility]   Lab results show persistent elevated Creatinine, CKD III  Plan:  1. Continue current BP regimen - HCTZ 25mg , Losartan 25mg  - Discussed importance of improved water intake, he has very limited water intake now, avoid soda and inc water 2. Encourage improved lifestyle - low sodium diet, regular exercise 3. Keep monitor BP outside office, bring readings to next visit, if persistently >150/90 or new symptoms notify office sooner      Relevant Medications   atorvastatin (LIPITOR) 40 MG tablet    Other Visit Diagnoses    Needs flu shot       Relevant Orders   Flu Vaccine QUAD High Dose(Fluad) (Completed)     #R Inguinal Hernia, suspected Localized symptoms consistent with R inguinal hernia, limited herniation provoked on exam, but he does endorse it will bulge with certain position and movement, he says it is reducible. No prior hernia No acute injury but some heavier lifting furniture lately No acute red flag symptoms (without nausea vomiting, bowel obstruction, systemic illness,  uncontrolled pain)  Plan: 1. Reassurance given with mild reducible intermittent hernia vs difficult to confirm even if actual hernia present, may be stable chronic problem and not progress 2. Recommend  conservative therapy limit provoking activities and avoid heavy lifting, try to rest supine and trendelenburg if bulging, may use topical ice packs, hernia support truss / strap as needed or more regularly if worsening, can use Tylenol PRN 3. Strict return criteria and when to go to hospital ED for more acute evaluation if any significant worsening, constant pain, systemic symptoms, or potential incarceration bulge that does not reduce 4. Follow-up as needed  If not improved can refer to Gen Surgery   No orders of the defined types were placed in this encounter.     Follow up plan: Return in about 4 months (around 05/01/2020) for right inguinal hernia.  [name], [facility] 12/31/2019, 1:36 PM

## 2019-12-31 NOTE — Patient Instructions (Addendum)
Thank you for coming to the office today.  1. Chemistry - Persistent elevated Creatinine, back to 1.51 prior 1.29-1.4, mild hyponatremia.  Kidney function is still declined.  2. PSA Prostate Cancer Screening - 3.3 - reduced from 5.0, 3 months ago.  ---------------------------  You most likely have an Right Inguinal Hernia.  This is caused by a weakness in your abdominal or groin muscles, and is caused by bowel or fatty tissue pushing through this weak spot causing pain and bulging.  Recommend a "Hernia Truss", try to wear this regularly (do not need to wear to bed if pain is improved), until it feels better and then only with activities - If it is not improving then you may need to wear it every day, especially if you cannot have a surgery to fix the hernia  You can find a Truss OTC at [place] can try a medical supply pharmacy like:  [facility] [place] Address: [address] [place], [place] [address] [address] [place] Phone: [phone]  Try to find positions that give you most relief, likely laying down with head lower than body will allow the hernia bulging to go back into place and feel better.  May take Tylenol as needed. Recommend to start taking Tylenol Extra Strength 500mg  tabs - take 1 to 2 tabs per dose (max 1000mg ) every 6-8 hours for pain (take regularly, don't skip a dose for next 7 days), max 24 hour daily dose is 6 tablets or 3000mg . In the future you can repeat the same everyday Tylenol course for 1-2 weeks at a time.   Can try topical ice packs or muscle rub if burning nerve sensation.  If significant worsening pain or you get bulging that does NOT go down or go away and CANNOT push back in, or nausea, vomiting, then it is very important to go directly to hospital ED for more immediate evaluation, as this can be a life threatening surgical emergency    Please schedule a Follow-up Appointment to: Return in about 4 months (around  05/01/2020) for right inguinal hernia.  If you have any other questions or concerns, please feel free to call the office or send a message through [place]. You may also schedule an earlier appointment if necessary.  Additionally, you may be receiving a survey about your experience at our office within a few days to 1 week by e-mail or mail. We value your feedback.  [name], DO [place]

## 2020-01-06 DIAGNOSIS — L821 Other seborrheic keratosis: Secondary | ICD-10-CM | POA: Diagnosis not present

## 2020-01-06 DIAGNOSIS — C44619 Basal cell carcinoma of skin of left upper limb, including shoulder: Secondary | ICD-10-CM | POA: Diagnosis not present

## 2020-01-06 DIAGNOSIS — C44319 Basal cell carcinoma of skin of other parts of face: Secondary | ICD-10-CM | POA: Diagnosis not present

## 2020-01-06 DIAGNOSIS — I10 Essential (primary) hypertension: Secondary | ICD-10-CM | POA: Diagnosis not present

## 2020-01-06 DIAGNOSIS — D492 Neoplasm of unspecified behavior of bone, soft tissue, and skin: Secondary | ICD-10-CM | POA: Diagnosis not present

## 2020-01-06 DIAGNOSIS — C44329 Squamous cell carcinoma of skin of other parts of face: Secondary | ICD-10-CM | POA: Diagnosis not present

## 2020-02-16 ENCOUNTER — Ambulatory Visit: Payer: Medicare Other | Admitting: Family Medicine

## 2020-02-17 ENCOUNTER — Encounter: Payer: Self-pay | Admitting: Family Medicine

## 2020-02-17 ENCOUNTER — Ambulatory Visit (INDEPENDENT_AMBULATORY_CARE_PROVIDER_SITE_OTHER): Payer: Medicare Other | Admitting: Family Medicine

## 2020-02-17 VITALS — BP 166/85 | HR 70 | Temp 98.0°F | Resp 16 | Ht 70.0 in | Wt 147.0 lb

## 2020-02-17 DIAGNOSIS — M25512 Pain in left shoulder: Secondary | ICD-10-CM

## 2020-02-17 DIAGNOSIS — M62838 Other muscle spasm: Secondary | ICD-10-CM | POA: Diagnosis not present

## 2020-02-17 DIAGNOSIS — S134XXA Sprain of ligaments of cervical spine, initial encounter: Secondary | ICD-10-CM

## 2020-02-17 DIAGNOSIS — M62830 Muscle spasm of back: Secondary | ICD-10-CM

## 2020-02-17 DIAGNOSIS — M542 Cervicalgia: Secondary | ICD-10-CM | POA: Diagnosis not present

## 2020-02-17 DIAGNOSIS — S2020XA Contusion of thorax, unspecified, initial encounter: Secondary | ICD-10-CM

## 2020-02-17 MED ORDER — GABAPENTIN 100 MG PO CAPS: ORAL_CAPSULE | ORAL | 1 refills | Status: DC

## 2020-02-17 NOTE — Patient Instructions (Addendum)
Thank you for coming to the office today.  Start Gabapentin 100mg  capsules, take one at night for 2-3 nights only, and then increase to 2 pills at bedtime if needed, eventually max dose is 3 pills at once at bedtime.  Recommend to start taking Tylenol Extra Strength 500mg  tabs - take 1 to 2 tabs per dose (max 1000mg ) every 6-8 hours for pain (take regularly, don't skip a dose for next 7 days), max 24 hour daily dose is 6 tablets or 3000mg . In the future you can repeat the same everyday Tylenol course for 1-2 weeks at a time.    Please schedule a Follow-up Appointment to: Return if symptoms worsen or fail to improve, for 4 week if not improved pain.  If you have any other questions or concerns, please feel free to call the office or send a message through [place]. You may also schedule an earlier appointment if necessary.  Additionally, you may be receiving a survey about your experience at our office within a few days to 1 week by e-mail or mail. We value your feedback.  [name], DO [place]
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
## 2020-02-27 ENCOUNTER — Telehealth: Payer: Self-pay | Admitting: Family Medicine

## 2020-02-27 NOTE — Telephone Encounter (Signed)
LM for patient

## 2020-02-27 NOTE — Telephone Encounter (Signed)
Patient is calling to ask Dr. Raliegh Ip if he needs to follow up on his neck pain. Patient is doing better. Please advise Cb- [phone]

## 2020-02-27 NOTE — Telephone Encounter (Signed)
No follow up needed for this issue if he is doing better with neck pain.  [name], DO [facility] 02/27/2020, 4:03 PM

## 2020-03-29 DIAGNOSIS — L988 Other specified disorders of the skin and subcutaneous tissue: Secondary | ICD-10-CM | POA: Diagnosis not present

## 2020-03-29 DIAGNOSIS — C44329 Squamous cell carcinoma of skin of other parts of face: Secondary | ICD-10-CM | POA: Diagnosis not present

## 2020-03-29 DIAGNOSIS — L814 Other melanin hyperpigmentation: Secondary | ICD-10-CM | POA: Diagnosis not present

## 2020-03-29 DIAGNOSIS — L578 Other skin changes due to chronic exposure to nonionizing radiation: Secondary | ICD-10-CM | POA: Diagnosis not present

## 2020-03-29 DIAGNOSIS — C44319 Basal cell carcinoma of skin of other parts of face: Secondary | ICD-10-CM | POA: Diagnosis not present

## 2020-04-05 DIAGNOSIS — L570 Actinic keratosis: Secondary | ICD-10-CM | POA: Diagnosis not present

## 2020-06-01 DIAGNOSIS — Z85828 Personal history of other malignant neoplasm of skin: Secondary | ICD-10-CM | POA: Diagnosis not present

## 2020-06-01 DIAGNOSIS — L578 Other skin changes due to chronic exposure to nonionizing radiation: Secondary | ICD-10-CM | POA: Diagnosis not present

## 2020-06-01 DIAGNOSIS — L570 Actinic keratosis: Secondary | ICD-10-CM | POA: Diagnosis not present

## 2020-09-17 ENCOUNTER — Other Ambulatory Visit: Payer: Self-pay | Admitting: Family Medicine

## 2020-09-17 DIAGNOSIS — I10 Essential (primary) hypertension: Secondary | ICD-10-CM

## 2020-09-17 NOTE — Telephone Encounter (Signed)
Patient is requesting 90 days supply on courtesy RF- sent to office for review of request

## 2020-09-17 NOTE — Telephone Encounter (Signed)
Attempted to call patient to schedule appointment- left message to call office- note on Rx. Courtesy RF #[phone] given

## 2020-09-17 NOTE — Telephone Encounter (Signed)
Patient is requesting 90 day supply on courtesy refill- no appointment scheduled. Request sent to office for review of request.

## 2020-09-27 ENCOUNTER — Encounter: Payer: Self-pay | Admitting: Family Medicine

## 2020-09-27 ENCOUNTER — Other Ambulatory Visit: Payer: Self-pay

## 2020-09-27 ENCOUNTER — Ambulatory Visit (INDEPENDENT_AMBULATORY_CARE_PROVIDER_SITE_OTHER): Payer: 59 | Admitting: Family Medicine

## 2020-09-27 VITALS — BP 131/83 | HR 67 | Ht 70.0 in | Wt 143.4 lb

## 2020-09-27 DIAGNOSIS — N183 Chronic kidney disease, stage 3 unspecified: Secondary | ICD-10-CM | POA: Diagnosis not present

## 2020-09-27 DIAGNOSIS — F4310 Post-traumatic stress disorder, unspecified: Secondary | ICD-10-CM | POA: Diagnosis not present

## 2020-09-27 DIAGNOSIS — I129 Hypertensive chronic kidney disease with stage 1 through stage 4 chronic kidney disease, or unspecified chronic kidney disease: Secondary | ICD-10-CM | POA: Diagnosis not present

## 2020-09-27 DIAGNOSIS — J432 Centrilobular emphysema: Secondary | ICD-10-CM | POA: Diagnosis not present

## 2020-09-27 MED ORDER — LOSARTAN POTASSIUM 25 MG PO TABS: 25.0000 mg | ORAL_TABLET | Freq: Every day | ORAL | 3 refills | Status: DC

## 2020-09-27 MED ORDER — HYDROCHLOROTHIAZIDE 25 MG PO TABS: 25.0000 mg | ORAL_TABLET | Freq: Every day | ORAL | 3 refills | Status: DC

## 2020-09-27 NOTE — Assessment & Plan Note (Signed)
Chronic problem Managed without rx medication He has a dog that is considered Chief Technology Officer, will write letter supporting this.

## 2020-09-27 NOTE — Assessment & Plan Note (Signed)
Chronic problem Still smoker Episodic flare up with tightness with breathing. Declines inhalers or other therapy

## 2020-09-27 NOTE — Patient Instructions (Addendum)
Thank you for coming to the office today.  Letter written for documenting that your dog can serve as an emotional support animal. But I do not have the authority to authorize your dog as a "Service Animal"  Refilled medicines  DUE for FASTING BLOOD WORK (no food or drink after midnight before the lab appointment, only water or coffee without cream/sugar on the morning of)  SCHEDULE "Lab Only" visit in the morning at the clinic for lab draw in 3 MONTHS   - Make sure Lab Only appointment is at about 1 week before your next appointment, so that results will be available  For Lab Results, once available within 2-3 days of blood draw, you can can log in to MyChart online to view your results and a brief explanation. Also, we can discuss results at next follow-up visit.   Please schedule a Follow-up Appointment to: Return in about 3 months (around 12/28/2020) for 3 month non fasting lab only then 1 week later Follow-up HTN, CKD.  If you have any other questions or concerns, please feel free to call the office or send a message through [place]. You may also schedule an earlier appointment if necessary.  Additionally, you may be receiving a survey about your experience at our office within a few days to 1 week by e-mail or mail. We value your feedback.  [name], DO [place]

## 2020-09-27 NOTE — Progress Notes (Signed)
Subjective:    Patient ID: Alexander Rowe, male    DOB: 05-25-1940, 80 y.o.   MRN: [id number]  Alexander Rowe is a 80 y.o. 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  He has several spots on skin for evaluation by Dermatology at next apt.  He has a spot on R Forearm that appears may have been a spider bite, now healing.  HTN CKDIII Prior lab reviewed, mild elevated Cr trend. Due for med refills on BP medication Taking regularly  Losartan 25mg  and HCTZ 25mg  Improving hydration  COPD Smoker Admits some chest tightness with breathing at times. He has declined inhaler therapy in past. Not interested today.  Depression screen [facility] 2/9 12/31/2019 09/19/2019 06/28/2018  Decreased Interest 0 0 0  Down, Depressed, Hopeless 0 0 0  PHQ - 2 Score 0 0 0  Altered sleeping - - -  Tired, decreased energy - - -  Change in appetite - - -  Feeling bad or failure about yourself  - - -  Trouble concentrating - - -  Moving slowly or fidgety/restless - - -  Suicidal thoughts - - -  PHQ-9 Score - - -  Difficult doing work/chores - - -    Social History   Tobacco Use  . Smoking status: Current Every Day Smoker    Packs/day: 1.00    Years: 50.00    Pack years: 50.00  . Smokeless tobacco: Current User  Vaping Use  . Vaping Use: Never used  Substance Use Topics  . Alcohol use: Yes    Comment: couple beers weekends  . Drug use: No    Review of Systems Per HPI unless specifically indicated above     Objective:    BP 131/83   Pulse 67   Ht 5\' 10"  (1.778 m)   Wt 143 lb 6.4 oz (65 kg)   SpO2 100%   BMI 20.58 kg/m   Wt Readings from Last 3 Encounters:  09/27/20 143 lb 6.4 oz (65 kg)  02/17/20 147 lb (66.7 kg)  12/31/19 148 lb 8 oz (67.4 kg)    Physical Exam Vitals and nursing note reviewed.  Constitutional:       General: He is not in acute distress.    Appearance: He is well-developed. He is not diaphoretic.     Comments: Well-appearing, comfortable, cooperative  HENT:     Head: Normocephalic and atraumatic.  Eyes:     General:        Right eye: No discharge.        Left eye: No discharge.     Conjunctiva/sclera: Conjunctivae normal.  Cardiovascular:     Rate and Rhythm: Normal rate.  Pulmonary:     Effort: Pulmonary effort is normal.  Skin:    General: Skin is warm and dry.     Findings: No erythema or rash.  Neurological:     Mental Status: He is alert and oriented to person, place, and time.  Psychiatric:        Behavior: Behavior normal.     Comments: Well groomed, good eye contact, normal speech and thoughts      Results for orders placed or performed in visit on 05/02/30  BASIC METABOLIC PANEL WITH GFR  Result Value Ref Range   Glucose, Bld 61 (L) 65 -  99 mg/dL   BUN 16 7 - 25 mg/dL   Creat 1.51 (H) 0.70 - 1.11 mg/dL   GFR, Est Non African American 43 (L) > OR = 60 mL/min/1.36m2   GFR, Est African American 50 (L) > OR = 60 mL/min/1.46m2   BUN/Creatinine Ratio 11 6 - 22 (calc)   Sodium 133 (L) 135 - 146 mmol/L   Potassium 4.0 3.5 - 5.3 mmol/L   Chloride 95 (L) 98 - 110 mmol/L   CO2 31 20 - 32 mmol/L   Calcium 10.1 8.6 - 10.3 mg/dL  PSA  Result Value Ref Range   PSA 3.3 < OR = 4.0 ng/mL      Assessment & Plan:   Problem List Items Addressed This Visit    PTSD (post-traumatic stress disorder) - Primary    Chronic problem Managed without rx medication He has a dog that is considered Emotional Support Animal, will write letter supporting this.      Relevant Orders   BASIC METABOLIC PANEL WITH GFR   Centrilobular emphysema ([place])    Chronic problem Still smoker Episodic flare up with tightness with breathing. Declines inhalers or other therapy      Benign hypertension with CKD (chronic kidney disease) stage III (HCC)    Normal BP - Home BP readings reviewed,  infrequent check Complication with CKD-III - [facility]   Lab results show persistent elevated Creatinine, CKD III  Plan:  1. Continue current BP regimen - HCTZ 25mg , Losartan 25mg  - Discussed importance of improved water intake, he has very limited water intake now, avoid soda and inc water 2. Encourage improved lifestyle - low sodium diet, regular exercise 3. Keep monitor BP outside office, bring readings to next visit, if persistently >150/90 or new symptoms notify office sooner      Relevant Medications   losartan (COZAAR) 25 MG tablet   hydrochlorothiazide (HYDRODIURIL) 25 MG tablet   Other Relevant Orders   BASIC METABOLIC PANEL WITH GFR      Meds ordered this encounter  Medications  . losartan (COZAAR) 25 MG tablet    Sig: Take 1 tablet (25 mg total) by mouth daily.    Dispense:  90 tablet    Refill:  3    **Patient requests 90 days supply** add refills on file.  . hydrochlorothiazide (HYDRODIURIL) 25 MG tablet    Sig: Take 1 tablet (25 mg total) by mouth daily.    Dispense:  90 tablet    Refill:  3    **Patient requests 90 days supply** add refills on file      Follow up plan: Return in about 3 months (around 12/28/2020) for 3 month non fasting lab only then 1 week later Follow-up HTN, CKD.  Future labs ordered for 12/2020 BMET   [name], [facility] 09/27/2020, 1:49 PM

## 2020-09-27 NOTE — Assessment & Plan Note (Signed)
Normal BP - Home BP readings reviewed, infrequent check Complication with CKD-III - [facility] longer [facility]   Lab results show persistent elevated Creatinine, CKD III  Plan:  1. Continue current BP regimen - HCTZ 25mg , Losartan 25mg  - Discussed importance of improved water intake, he has very limited water intake now, avoid soda and inc water 2. Encourage improved lifestyle - low sodium diet, regular exercise 3. Keep monitor BP outside office, bring readings to next visit, if persistently >150/90 or new symptoms notify office sooner

## 2020-11-18 ENCOUNTER — Telehealth: Payer: Self-pay | Admitting: Family Medicine

## 2020-11-18 NOTE — Telephone Encounter (Signed)
Copied from [place] [phone]. Topic: Medicare AWV >> Nov 18, 2020  1:57 PM Cher Nakai R wrote: Reason for CRM:  Left message for patient to call back and schedule Medicare Annual Wellness Visit (AWV) to be done virtually or by telephone.  No hx of AWV eligible as of 04/24/2009 for AWVI  Please schedule at anytime with [facility].      40 Minutes appointment   Any questions, please call me at [phone]

## 2020-11-26 ENCOUNTER — Other Ambulatory Visit: Payer: Self-pay | Admitting: Family Medicine

## 2020-11-26 DIAGNOSIS — E782 Mixed hyperlipidemia: Secondary | ICD-10-CM

## 2020-11-26 DIAGNOSIS — Z862 Personal history of diseases of the blood and blood-forming organs and certain disorders involving the immune mechanism: Secondary | ICD-10-CM

## 2020-11-26 DIAGNOSIS — R972 Elevated prostate specific antigen [PSA]: Secondary | ICD-10-CM

## 2020-11-26 DIAGNOSIS — R7309 Other abnormal glucose: Secondary | ICD-10-CM

## 2020-11-26 DIAGNOSIS — Z0000 Encounter for general adult medical examination without abnormal findings: Secondary | ICD-10-CM

## 2020-11-26 DIAGNOSIS — N183 Chronic kidney disease, stage 3 unspecified: Secondary | ICD-10-CM

## 2020-11-26 DIAGNOSIS — J432 Centrilobular emphysema: Secondary | ICD-10-CM

## 2020-11-26 DIAGNOSIS — I129 Hypertensive chronic kidney disease with stage 1 through stage 4 chronic kidney disease, or unspecified chronic kidney disease: Secondary | ICD-10-CM

## 2020-12-01 ENCOUNTER — Ambulatory Visit
Admission: RE | Admit: 2020-12-01 | Discharge: 2020-12-01 | Disposition: A | Discharge status: Home / Self Care | Payer: 59 | Attending: Family Medicine | Admitting: Family Medicine

## 2020-12-01 ENCOUNTER — Ambulatory Visit (INDEPENDENT_AMBULATORY_CARE_PROVIDER_SITE_OTHER): Payer: 59 | Admitting: Family Medicine

## 2020-12-01 ENCOUNTER — Encounter: Payer: Self-pay | Admitting: Family Medicine

## 2020-12-01 ENCOUNTER — Ambulatory Visit
Admission: RE | Admit: 2020-12-01 | Discharge: 2020-12-01 | Disposition: A | Discharge status: Home / Self Care | Payer: 59 | Source: Ambulatory Visit | Attending: Family Medicine | Admitting: Family Medicine

## 2020-12-01 ENCOUNTER — Other Ambulatory Visit: Payer: Self-pay

## 2020-12-01 VITALS — BP 177/97 | HR 62 | Ht 70.0 in | Wt 143.2 lb

## 2020-12-01 DIAGNOSIS — M2450 Contracture, unspecified joint: Secondary | ICD-10-CM

## 2020-12-01 DIAGNOSIS — N183 Chronic kidney disease, stage 3 unspecified: Secondary | ICD-10-CM

## 2020-12-01 DIAGNOSIS — L98A291 Non-pressure chronic ulcer of unspecified forearm limited to breakdown of skin: Secondary | ICD-10-CM

## 2020-12-01 DIAGNOSIS — L98491 Non-pressure chronic ulcer of skin of other sites limited to breakdown of skin: Secondary | ICD-10-CM

## 2020-12-01 DIAGNOSIS — K219 Gastro-esophageal reflux disease without esophagitis: Secondary | ICD-10-CM

## 2020-12-01 DIAGNOSIS — I129 Hypertensive chronic kidney disease with stage 1 through stage 4 chronic kidney disease, or unspecified chronic kidney disease: Secondary | ICD-10-CM

## 2020-12-01 DIAGNOSIS — L989 Disorder of the skin and subcutaneous tissue, unspecified: Secondary | ICD-10-CM

## 2020-12-01 DIAGNOSIS — M79645 Pain in left finger(s): Secondary | ICD-10-CM

## 2020-12-01 DIAGNOSIS — F4310 Post-traumatic stress disorder, unspecified: Secondary | ICD-10-CM

## 2020-12-01 DIAGNOSIS — T63301A Toxic effect of unspecified spider venom, accidental (unintentional), initial encounter: Secondary | ICD-10-CM

## 2020-12-01 LAB — BASIC METABOLIC PANEL WITH GFR
BUN/Creatinine Ratio: 13 (calc) (ref 6–22)
BUN: 17 mg/dL (ref 7–25)
CO2: 28 mmol/L (ref 20–32)
Calcium: 10.3 mg/dL (ref 8.6–10.3)
Chloride: 97 mmol/L — ABNORMAL LOW (ref 98–110)
Creat: 1.34 mg/dL — ABNORMAL HIGH (ref 0.70–1.22)
Glucose, Bld: 95 mg/dL (ref 65–139)
Potassium: 4 mmol/L (ref 3.5–5.3)
Sodium: 133 mmol/L — ABNORMAL LOW (ref 135–146)
eGFR: 53 mL/min/{1.73_m2} — ABNORMAL LOW (ref >=60)

## 2020-12-01 MED ORDER — MUPIROCIN CALCIUM 2 % EX CREA: 1.0000 "application " | TOPICAL_CREAM | Freq: Two times a day (BID) | CUTANEOUS | 1 refills | Status: DC

## 2020-12-01 MED ORDER — OMEPRAZOLE 20 MG PO CPDR: 20.0000 mg | DELAYED_RELEASE_CAPSULE | Freq: Every day | ORAL | 3 refills | Status: DC

## 2020-12-01 NOTE — Progress Notes (Signed)
Subjective:    Patient ID: Alexander Rowe, male    DOB: 1940/05/08, 80 y.o.   MRN: [id number]  Alexander Rowe is a 80 y.o. male presenting on 12/01/2020 for Heartburn and Finger Injury   HPI  Ulceration / R Forearm Spider Bite 3 weeks ago, Ulceration healing now has scab No spreading redness or fever   HTN CKDIII Elevated BP did not take meds today Prior lab reviewed, mild elevated Cr trend. 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, Contracture Chronic problem, fall injury with inc pain X-ray today    Depression screen [facility] 2/9 12/31/2019 09/19/2019 06/28/2018  Decreased Interest 0 0 0  Down, Depressed, Hopeless 0 0 0  PHQ - 2 Score 0 0 0  Altered sleeping - - -  Tired, decreased energy - - -  Change in appetite - - -  Feeling bad or failure about yourself  - - -  Trouble concentrating - - -  Moving slowly or fidgety/restless - - -  Suicidal thoughts - - -  PHQ-9 Score - - -  Difficult doing work/chores - - -    Social History   Tobacco Use   Smoking status: Every Day    Packs/day: 1.00    Years: 50.00    Pack years: 50.00    Types: Cigarettes   Smokeless tobacco: Current  Vaping Use   Vaping Use: Never used  Substance Use Topics   Alcohol use: Yes    Comment: couple beers weekends   Drug use: No    Review of Systems Per HPI unless specifically indicated above     Objective:    BP (!) 177/97   Pulse 62   Ht '5\' 10"'$  (1.778 m)   Wt 143 lb 3.2 oz (65 kg)   SpO2 100%   BMI 20.55 kg/m   Wt Readings from Last 3 Encounters:  12/01/20 143 lb 3.2 oz (65 kg)  09/27/20 143 lb 6.4 oz (65 kg)  02/17/20 147 lb (66.7 kg)    Physical Exam Vitals and nursing note reviewed.  Constitutional:      General: He is not in acute distress.    Appearance: He is well-developed. He is not diaphoretic.     Comments: Well-appearing, comfortable, cooperative  HENT:     Head: Normocephalic and  atraumatic.  Eyes:     General:        Right eye: No discharge.        Left eye: No discharge.     Conjunctiva/sclera: Conjunctivae normal.  Neck:     Thyroid: No thyromegaly.  Cardiovascular:     Rate and Rhythm: Normal rate and regular rhythm.     Pulses: Normal pulses.     Heart sounds: Normal heart sounds. No murmur heard. Pulmonary:     Effort: Pulmonary effort is normal. No respiratory distress.     Breath sounds: Normal breath sounds. No wheezing or rales.  Musculoskeletal:        General: Normal range of motion.     Cervical back: Normal range of motion and neck supple.     Comments: L 5th finger flexion contracture, pain  Lymphadenopathy:     Cervical: No cervical adenopathy.  Skin:    General: Skin is warm and dry.     Findings: Lesion (R forearm with healing superficial ulceration) present. No erythema or rash.  Neurological:     Mental  Status: He is alert and oriented to person, place, and time. Mental status is at baseline.  Psychiatric:        Behavior: Behavior normal.     Comments: Well groomed, good eye contact, normal speech and thoughts   Results for orders placed or performed in visit on [id number]  BASIC METABOLIC PANEL WITH GFR  Result Value Ref Range   Glucose, Bld 61 (L) 65 - 99 mg/dL   BUN 16 7 - 25 mg/dL   Creat 1.51 (H) 0.70 - 1.11 mg/dL   GFR, Est Non African American 43 (L) > OR = 60 mL/min/1.15m   GFR, Est African American 50 (L) > OR = 60 mL/min/1.751m  BUN/Creatinine Ratio 11 6 - 22 (calc)   Sodium 133 (L) 135 - 146 mmol/L   Potassium 4.0 3.5 - 5.3 mmol/L   Chloride 95 (L) 98 - 110 mmol/L   CO2 31 20 - 32 mmol/L   Calcium 10.1 8.6 - 10.3 mg/dL  PSA  Result Value Ref Range   PSA 3.3 < OR = 4.0 ng/mL      Assessment & Plan:   Problem List Items Addressed This Visit     Benign hypertension with CKD (chronic kidney disease) stage III (HCC) - Primary   Other Visit Diagnoses     Spider bite wound, accidental or unintentional, initial  encounter       Skin ulcer of forearm, limited to breakdown of skin (HCC)       Relevant Medications   mupirocin cream (BACTROBAN) 2 %   Non-healing skin lesion       Pain in finger of left hand       Relevant Orders   DG Finger [place] Left   Flexion contracture       Relevant Orders   DG Finger [place] Left   Gastroesophageal reflux disease without esophagitis       Relevant Medications   omeprazole (PRILOSEC) 20 MG capsule       #spider bite, forearm, superficial ulceration Rx topical antibiotic, seems scabbed and healing now. 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 X-ray today, reviewed results later no acute fracture Will contact patient for Ortho referral next step  #Abnormal non healing lesion of skin R neck - should call his Dermatologist and follow up   Meds ordered this encounter  Medications   mupirocin cream (BACTROBAN) 2 %    Sig: Apply 1 application topically 2 (two) times daily. For 2 weeks    Dispense:  15 g    Refill:  1   omeprazole (PRILOSEC) 20 MG capsule    Sig: Take 1 capsule (20 mg total) by mouth daily before breakfast.    Dispense:  30 capsule    Refill:  3     Follow up plan: Return if symptoms worsen or fail to improve.   AlNobie PutnamDO [facility] 12/01/2020, 11:13 AM

## 2020-12-01 NOTE — Patient Instructions (Addendum)
Thank you for coming to the office today  Xray today, stay tuned for results. We may recommend orthopedic specialist.  [facility] ([facility] Address: [address], [place], [place] [address] Hours:  9AM-5PM Phone: [phone]  Take Omeprazole daily for stomach acid  Take Blood pressure pills daily  Use topical Bactroban for spider bite twice a day to help it heal. For 1-2 weeks  Please call Dr Manley Mason to schedule follow-up on the Non Healing Skin Lesion on Back of Neck it may be pre cancer or early skin cancer.  [address], [address]   [address], [address]   [place], [place] [address]   [phone] (Work)   Please schedule a Follow-up Appointment to: Return if symptoms worsen or fail to improve.  If you have any other questions or concerns, please feel free to call the office or send a message through [place]. You may also schedule an earlier appointment if necessary.  Additionally, you may be receiving a survey about your experience at our office within a few days to 1 week by e-mail or mail. We value your feedback.  [name], DO [place]

## 2021-02-03 ENCOUNTER — Ambulatory Visit (INDEPENDENT_AMBULATORY_CARE_PROVIDER_SITE_OTHER): Payer: 59 | Admitting: Family Medicine

## 2021-02-03 ENCOUNTER — Other Ambulatory Visit: Payer: Self-pay

## 2021-02-03 ENCOUNTER — Encounter: Payer: Self-pay | Admitting: Family Medicine

## 2021-02-03 VITALS — BP 182/94 | HR 72 | Ht 70.0 in | Wt 143.0 lb

## 2021-02-03 DIAGNOSIS — H9113 Presbycusis, bilateral: Secondary | ICD-10-CM

## 2021-02-03 DIAGNOSIS — H6123 Impacted cerumen, bilateral: Secondary | ICD-10-CM | POA: Diagnosis not present

## 2021-02-03 DIAGNOSIS — N183 Chronic kidney disease, stage 3 unspecified: Secondary | ICD-10-CM | POA: Diagnosis not present

## 2021-02-03 DIAGNOSIS — I129 Hypertensive chronic kidney disease with stage 1 through stage 4 chronic kidney disease, or unspecified chronic kidney disease: Secondary | ICD-10-CM

## 2021-02-03 MED ORDER — LOSARTAN POTASSIUM 50 MG PO TABS: 50.0000 mg | ORAL_TABLET | Freq: Every day | ORAL | 1 refills | Status: DC

## 2021-02-03 NOTE — Patient Instructions (Addendum)
Thank you for coming to the office today.  Most of ear wax removed. Some may still be deeper, we got as much as we could get out.  Increase dose Losartan from 25mg  once daily up to 50mg  once daily. New rx sent to pharmacy. You can take TWO of the OLD 25mg  pills at the same time OR take ONE of the NEW Pills   Please schedule a Follow-up Appointment to: Return in about 3 months (around 05/06/2021) for 3 month HTN.  If you have any other questions or concerns, please feel free to call the office or send a message through [place]. You may also schedule an earlier appointment if necessary.  Additionally, you may be receiving a survey about your experience at our office within a few days to 1 week by e-mail or mail. We value your feedback.  [name], DO [place]

## 2021-02-03 NOTE — Progress Notes (Signed)
Subjective:    Patient ID: Carolann Littler, male    DOB: Jan 19, 1941, 80 y.o.   MRN: [id number]  SERGEY ISHLER is a 80 y.o. male presenting on 02/03/2021 for Ear Fullness   HPI  Bilateral Cerumen Impaction Hearing Loss Bilateral, chronic He has hearing aids and now will go see Audiology for replacement and adjustment needs ear wax cleaned. He has fullness of ears with wax, has not tried to remove. 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 Denies pain dizziness headache lightheaded  Hypertension Elevated BP today and on past review On Losartan 59m and HCTZ 239m  [facility] 12/31[facility]2019 09/19/2019 06/28/2018  Decreased Interest 0 0 0  Down, Depressed, Hopeless 0 0 0  PHQ - 2 Score 0 0 0  Altered sleeping - - -  Tired, decreased energy - - -  Change in appetite - - -  Feeling bad or failure about yourself  - - -  Trouble concentrating - - -  Moving slowly or fidgety/restless - - -  Suicidal thoughts - - -  PHQ-9 Score - - -  Difficult doing work/chores - - -    Social History   Tobacco Use   Smoking status: Every Day    Packs/day: 1.00    Years: 50.00    Pack years: 50.00    Types: Cigarettes   Smokeless tobacco: Current  Vaping Use   Vaping Use: Never used  Substance Use Topics   Alcohol use: Yes    Comment: couple beers weekends   Drug use: No    Review of Systems Per HPI unless specifically indicated above     Objective:    BP (!) 182/94   Pulse 72   Ht 5' 10"  (1.778 m)   Wt 143 lb (64.9 kg)   SpO2 95%   BMI 20.52 kg/m   Wt Readings from Last 3 Encounters:  02/03/21 143 lb (64.9 kg)  12/01/20 143 lb 3.2 oz (65 kg)  09/27/20 143 lb 6.4 oz (65 kg)    Physical Exam Vitals and nursing note reviewed.  Constitutional:      General: He is not in acute distress.    Appearance: Normal appearance. He is well-developed. He is not diaphoretic.     Comments: Well-appearing, comfortable, cooperative   HENT:     Head: Normocephalic and atraumatic.     Right Ear: There is impacted cerumen.     Left Ear: There is impacted cerumen.     Ears:     Comments: Abnormal external ear anatomy Eyes:     General:        Right eye: No discharge.        Left eye: No discharge.     Conjunctiva/sclera: Conjunctivae normal.  Cardiovascular:     Rate and Rhythm: Normal rate.  Pulmonary:     Effort: Pulmonary effort is normal.  Skin:    General: Skin is warm and dry.     Findings: No erythema or rash.  Neurological:     Mental Status: He is alert and oriented to person, place, and time.  Psychiatric:        Mood and Affect: Mood normal.        Behavior: Behavior normal.        Thought Content: Thought content normal.     Comments: Well groomed, good eye contact, normal speech and thoughts    ________________________________________________________ PROCEDURE NOTE  Date: 02/03/21 Bilateral Ear Lavage / Cerumen Removal Discussed benefits and risks (including pain / discomforts, dizziness, minor abrasion of ear canal). Verbal consent given by patient. Medication:  carbamide peroxide ear drops, Ear Lavage Solution (warm water + hydrogen peroxide) Performed by Dr Parks Ranger and Loni Muse CMA Several drops of carbamide peroxide placed in each ear, allowed to sit for few minutes. Ear lavage solution flushed into one ear at a time in attempt to dislodge and remove ear wax.   Results were mostly successful  Repeat Ear Exam: due to his abnormal ear canal anatomy post surgical history cannot fully visualize his TM  Results for orders placed or performed in visit on 49/17/91  BASIC METABOLIC PANEL WITH GFR  Result Value Ref Range   Glucose, Bld 95 65 - 139 mg/dL   BUN 17 7 - 25 mg/dL   Creat 1.34 (H) 0.70 - 1.22 mg/dL   eGFR 53 (L) > OR = 60 mL/min/1.5m   BUN/Creatinine Ratio 13 6 - 22 (calc)   Sodium 133 (L) 135 - 146 mmol/L   Potassium 4.0 3.5 - 5.3 mmol/L   Chloride 97 (L) 98 - 110  mmol/L   CO2 28 20 - 32 mmol/L   Calcium 10.3 8.6 - 10.3 mg/dL      Assessment & Plan:   Problem List Items Addressed This Visit     Presbycusis of both ears   Benign hypertension with CKD (chronic kidney disease) stage III (HCC)   Relevant Medications   losartan (COZAAR) 50 MG tablet   Other Visit Diagnoses     Bilateral impacted cerumen    -  Primary       Significant amount of large thick impacted cerumen bilaterally suspected primary cause of current reduced bilateral hearing He has chronic presbycusis and hearing aids, will have them adjusted tomorrow by audiology  Plan: 1. Successful office ear lavage cerumen removal today, re-evaluated with clear ear canals and normal TMs Follow-up as needed   #HTN Elevated BP Repeat BP Dose increase Losartan from 25 to 548mnew rx  Meds ordered this encounter  Medications   losartan (COZAAR) 50 MG tablet    Sig: Take 1 tablet (50 mg total) by mouth daily.    Dispense:  90 tablet    Refill:  1    Dose increase 25 to 5071m     Follow up plan: Return in about 3 months (around 05/06/2021) for 3 month HTN.  AleNobie PutnamO [facility] 02/03/2021, 9:10 AM

## 2021-05-06 ENCOUNTER — Ambulatory Visit: Payer: 59 | Admitting: Family Medicine

## 2021-05-26 ENCOUNTER — Telehealth: Payer: Self-pay | Admitting: Family Medicine

## 2021-05-26 NOTE — Telephone Encounter (Signed)
N/A unable to leave a message for patient to call back and schedule Medicare Annual Wellness Visit (AWV) to be done virtually or by telephone.  No hx of AWV eligible as of 04/24/09  Please schedule at anytime with [facility].      40 Minutes appointment   Any questions, please call me at [phone]

## 2021-07-08 ENCOUNTER — Ambulatory Visit (INDEPENDENT_AMBULATORY_CARE_PROVIDER_SITE_OTHER): Payer: 59 | Admitting: Family Medicine

## 2021-07-08 ENCOUNTER — Other Ambulatory Visit: Payer: Self-pay

## 2021-07-08 VITALS — BP 128/78 | HR 66 | Ht 70.0 in | Wt 145.8 lb

## 2021-07-08 DIAGNOSIS — L989 Disorder of the skin and subcutaneous tissue, unspecified: Secondary | ICD-10-CM

## 2021-07-08 DIAGNOSIS — I129 Hypertensive chronic kidney disease with stage 1 through stage 4 chronic kidney disease, or unspecified chronic kidney disease: Secondary | ICD-10-CM

## 2021-07-08 DIAGNOSIS — E782 Mixed hyperlipidemia: Secondary | ICD-10-CM

## 2021-07-08 DIAGNOSIS — N183 Chronic kidney disease, stage 3 unspecified: Secondary | ICD-10-CM

## 2021-07-08 DIAGNOSIS — Z8589 Personal history of malignant neoplasm of other organs and systems: Secondary | ICD-10-CM

## 2021-07-08 DIAGNOSIS — L309 Dermatitis, unspecified: Secondary | ICD-10-CM | POA: Diagnosis not present

## 2021-07-08 DIAGNOSIS — R351 Nocturia: Secondary | ICD-10-CM

## 2021-07-08 MED ORDER — TRIAMCINOLONE ACETONIDE 0.1 % EX CREA: 1.0000 "application " | TOPICAL_CREAM | Freq: Two times a day (BID) | CUTANEOUS | 0 refills | Status: AC

## 2021-07-08 MED ORDER — LOSARTAN POTASSIUM 50 MG PO TABS: 50.0000 mg | ORAL_TABLET | Freq: Every day | ORAL | 3 refills | Status: DC

## 2021-07-08 MED ORDER — HYDROCHLOROTHIAZIDE 25 MG PO TABS: 25.0000 mg | ORAL_TABLET | Freq: Every day | ORAL | 3 refills | Status: DC

## 2021-07-08 NOTE — Patient Instructions (Addendum)
Thank you for coming to the office today. ? ?Kern Alberta, MD   ?[address]   ?[id number], [address]   ?[place], [place] [address]   ?[phone] (Work)   ? ?Call them if you don't hear back in 1-2 weeks for Dermatologist ? ?Try the cream on the skin spots, twice a day for 2 weeks then stop. ? ?Labs today ? ?Refilled medication ? ?Please schedule a Follow-up Appointment to: Return in about 6 months (around 01/08/2022) for 6 month follow-up HTN. ? ?If you have any other questions or concerns, please feel free to call the office or send a message through [place]. You may also schedule an earlier appointment if necessary. ? ?Additionally, you may be receiving a survey about your experience at our office within a few days to 1 week by e-mail or mail. We value your feedback. ? ?[name], DO ?[place] ?

## 2021-07-08 NOTE — Progress Notes (Signed)
? ?Subjective:  ? ? Patient ID: Alexander Rowe, male    DOB: 1940/08/19, 81 y.o.   MRN: [id number] ? ?Alexander Rowe is a 81 y.o. male presenting on 07/08/2021 for Annual Exam ? ? ?HPI ? ?Here for Annual Physical and Lab orders ? ?Hearing Loss ?Hearing aid doesn't work, won't stay in ?Has to return to ENT ? ?History of Skin Cancer SCC ?Multiple skin lesions, needs to return to Dermatology ?He has areas of non healing on skin, mostly ears and face ?He has had reconstruction on his ears ?Scabs that are not healing, dry skin. ?Not using topicals ?Has not returned to Dermatology ? ?CHRONIC HTN: ?Reports no concerns, controlled ?Current Meds - Losartan 30m daily and HCTZ 228mdaily   ?Reports good compliance, took meds today. Tolerating well, w/o complaints. ?Denies CP, dyspnea, HA, edema, dizziness / lightheadedness ? ? ?Reviewed Vaccine record. ?Declines updates ? ?[facility] 07/08/2021 12/31/2019 09/19/2019  ?Decreased Interest 0 0 0  ?Down, Depressed, Hopeless 0 0 0  ?PHQ - 2 Score 0 0 0  ?Altered sleeping 0 - -  ?Tired, decreased energy 0 - -  ?Change in appetite 0 - -  ?Feeling bad or failure about yourself  0 - -  ?Trouble concentrating 0 - -  ?Moving slowly or fidgety/restless 0 - -  ?Suicidal thoughts 0 - -  ?PHQ-9 Score 0 - -  ?Difficult doing work/chores Not difficult at all - -  ? ? ?Past Medical History:  ?Diagnosis Date  ? Arthritis   ? [facility]  ? [facility]  ? Hypertension   ? ?Past Surgical History:  ?Procedure Laterality Date  ? CATARACT EXTRACTION W/PHACO Right 08/26/2015  ? Procedure: CATARACT EXTRACTION PHACO AND INTRAOCULAR LENS PLACEMENT (IOC);  Surgeon: BrEulogio BearMD;  Location: [facility] ORS;  Service: Ophthalmology;  Laterality: Right;  Lot #[id number] ?[place]02:13.9 ?AP%: 17.2 ?CDE: 22.94  ? CATARACT EXTRACTION W/PHACO Left 09/30/2015  ? Procedure: CATARACT EXTRACTION PHACO AND INTRAOCULAR LENS PLACEMENT (IOC);  Surgeon: BrEulogio BearMD;  Location: [facility] ORS;  Service: Ophthalmology;   Laterality: Left;  [place].33 ?AP% 19.2 ?CDE 29.47 ?FLUID PACK LOT # [id number] ?  ? EXTERNAL EAR SURGERY    ? removal and reconstruction  ? SKIN CANCER EXCISION    ? x9  ? ?Social History  ? ?Socioeconomic History  ? Marital status: Widowed  ?  Spouse name: Not on file  ? Number of children: Not on file  ? Years of education: High School  ? Highest education level: Not on file  ?Occupational History  ? Occupation: Retired ([employer])  ?Tobacco Use  ? Smoking status: Every Day  ?  Packs/day: 1.00  ?  Years: 50.00  ?  Pack years: 50.00  ?  Types: Cigarettes  ? Smokeless tobacco: Current  ?Vaping Use  ? Vaping Use: Never used  ?Substance and Sexual Activity  ? Alcohol use: Yes  ?  Comment: couple beers weekends  ? Drug use: No  ? Sexual activity: Yes  ?Other Topics Concern  ? Not on file  ?Social History Narrative  ? Not on file  ? ?Social Determinants of Health  ? ?Financial Resource Strain: Not on file  ?Food Insecurity: Not on file  ?Transportation Needs: Not on file  ?Physical Activity: Not on file  ?Stress: Not on file  ?Social Connections: Not on file  ?Intimate Partner Violence: Not on file  ? ?No family history on file. ?Current Outpatient  Medications on File Prior to Visit  ?Medication Sig  ? mupirocin cream (BACTROBAN) 2 % Apply 1 application topically 2 (two) times daily. For 2 weeks  ? omeprazole (PRILOSEC) 20 MG capsule Take 1 capsule (20 mg total) by mouth daily before breakfast.  ? ?No current facility-administered medications on file prior to visit.  ? ? ?Review of Systems ?Per HPI unless specifically indicated above ? ? ?   ?Objective:  ?  ?BP 128/78   Pulse 66   Ht 5' 10"  (1.778 m)   Wt 145 lb 12.8 oz (66.1 kg)   SpO2 96%   BMI 20.92 kg/m?   ?Wt Readings from Last 3 Encounters:  ?07/08/21 145 lb 12.8 oz (66.1 kg)  ?02/03/21 143 lb (64.9 kg)  ?12/01/20 143 lb 3.2 oz (65 kg)  ?  ?Physical Exam ?Vitals and nursing note reviewed.  ?Constitutional:   ?   General: He is not in acute distress. ?    Appearance: He is well-developed. He is not diaphoretic.  ?   Comments: Well-appearing, comfortable, cooperative  ?HENT:  ?   Head: Normocephalic and atraumatic.  ?   Ears:  ?   Comments: Hard of hearing ?Eyes:  ?   General:     ?   Right eye: No discharge.     ?   Left eye: No discharge.  ?   Conjunctiva/sclera: Conjunctivae normal.  ?   Pupils: Pupils are equal, round, and reactive to light.  ?Neck:  ?   Thyroid: No thyromegaly.  ?Cardiovascular:  ?   Rate and Rhythm: Normal rate and regular rhythm.  ?   Pulses: Normal pulses.  ?   Heart sounds: Normal heart sounds. No murmur heard. ?Pulmonary:  ?   Effort: Pulmonary effort is normal. No respiratory distress.  ?   Breath sounds: Normal breath sounds. No wheezing or rales.  ?Abdominal:  ?   General: Bowel sounds are normal. There is no distension.  ?   Palpations: Abdomen is soft. There is no mass.  ?   Tenderness: There is no abdominal tenderness.  ?Musculoskeletal:     ?   General: No tenderness. Normal range of motion.  ?   Cervical back: Normal range of motion and neck supple.  ?   Comments: Upper / Lower Extremities: ?- Normal muscle tone, strength bilateral upper extremities 5/5, lower extremities 5/5  ?Lymphadenopathy:  ?   Cervical: No cervical adenopathy.  ?Skin: ?   General: Skin is warm and dry.  ?   Findings: Lesion present. No erythema or rash.  ?Neurological:  ?   Mental Status: He is alert and oriented to person, place, and time.  ?   Comments: Distal sensation intact to light touch all extremities  ?Psychiatric:     ?   Mood and Affect: Mood normal.     ?   Behavior: Behavior normal.     ?   Thought Content: Thought content normal.  ?   Comments: Well groomed, good eye contact, normal speech and thoughts  ? ? ? ?Results for orders placed or performed in visit on 12/01/20  ?BASIC METABOLIC PANEL WITH GFR  ?Result Value Ref Range  ? Glucose, Bld 95 65 - 139 mg/dL  ? BUN 17 7 - 25 mg/dL  ? Creat 1.34 (H) 0.70 - 1.22 mg/dL  ? eGFR 53 (L) > OR = 60  mL/min/1.13m  ? BUN/Creatinine Ratio 13 6 - 22 (calc)  ? Sodium 133 (L) 135 - 146  mmol/L  ? Potassium 4.0 3.5 - 5.3 mmol/L  ? Chloride 97 (L) 98 - 110 mmol/L  ? CO2 28 20 - 32 mmol/L  ? Calcium 10.3 8.6 - 10.3 mg/dL  ? ?   ?Assessment & Plan:  ? ?Problem List Items Addressed This Visit   ? ? Hyperlipidemia  ? Relevant Medications  ? hydrochlorothiazide (HYDRODIURIL) 25 MG tablet  ? losartan (COZAAR) 50 MG tablet  ? Other Relevant Orders  ? Lipid panel  ? Benign hypertension with CKD (chronic kidney disease) stage III (HCC) - Primary  ? Relevant Medications  ? hydrochlorothiazide (HYDRODIURIL) 25 MG tablet  ? losartan (COZAAR) 50 MG tablet  ? Other Relevant Orders  ? COMPLETE METABOLIC PANEL WITH GFR  ? CBC with Differential/Platelet  ? ?Other Visit Diagnoses   ? ? Non-healing skin lesion      ? Relevant Medications  ? triamcinolone cream (KENALOG) 0.1 %  ? Other Relevant Orders  ? Ambulatory referral to Dermatology  ? History of squamous cell carcinoma      ? Relevant Orders  ? Ambulatory referral to Dermatology  ? Eczema, unspecified type      ? Relevant Medications  ? triamcinolone cream (KENALOG) 0.1 %  ? Nocturia      ? Relevant Orders  ? PSA  ? ?  ?  ?Updated Health Maintenance information ?Labs ordered, last meal hours ago. He prefers to do semi fasting labs now. ?Encouraged improvement to lifestyle with diet and exercise ?Goal maintain healthy weight ?HTN - controlled, refill medications ? ?Return to Dermatology  ? ?Kern Alberta, MD   ?[address]   ?[id number], [address]   ?[place], [place] [address]   ?[phone] (Work)   ? ?Try the steroid cream on the skin spots, twice a day for 2 weeks then stop. ? ?Labs today ? ?Refilled medication ? ?Orders Placed This Encounter  ?Procedures  ? COMPLETE METABOLIC PANEL WITH GFR  ? CBC with Differential/Platelet  ? Lipid panel  ?  Order Specific Question:   Has the patient fasted?  ?  Answer:   Yes  ? PSA  ? Ambulatory referral to Dermatology  ?  Referral  Priority:   Routine  ?  Referral Type:   Consultation  ?  Referral Reason:   Specialty Services Required  ?  Requested Specialty:   Dermatology  ?  Number of Visits Requested:   1  ? ? ? ? ?Meds ordered this encounte

## 2021-07-09 ENCOUNTER — Other Ambulatory Visit: Payer: Self-pay | Admitting: Internal Medicine

## 2021-07-09 DIAGNOSIS — I10 Essential (primary) hypertension: Secondary | ICD-10-CM

## 2021-07-09 DIAGNOSIS — I129 Hypertensive chronic kidney disease with stage 1 through stage 4 chronic kidney disease, or unspecified chronic kidney disease: Secondary | ICD-10-CM

## 2021-07-09 DIAGNOSIS — N183 Chronic kidney disease, stage 3 unspecified: Secondary | ICD-10-CM

## 2021-07-09 LAB — COMPLETE METABOLIC PANEL WITH GFR
AG Ratio: 2 (calc) (ref 1.0–2.5)
ALT: 11 U/L (ref 9–46)
AST: 16 U/L (ref 10–35)
Albumin: 4.5 g/dL (ref 3.6–5.1)
Alkaline phosphatase (APISO): 47 U/L (ref 35–144)
BUN/Creatinine Ratio: 16 (calc) (ref 6–22)
BUN: 23 mg/dL (ref 7–25)
CO2: 29 mmol/L (ref 20–32)
Calcium: 9.9 mg/dL (ref 8.6–10.3)
Chloride: 94 mmol/L — ABNORMAL LOW (ref 98–110)
Creat: 1.46 mg/dL — ABNORMAL HIGH (ref 0.70–1.22)
Globulin: 2.3 g/dL (calc) (ref 1.9–3.7)
Glucose, Bld: 100 mg/dL (ref 65–139)
Potassium: 4 mmol/L (ref 3.5–5.3)
Sodium: 131 mmol/L — ABNORMAL LOW (ref 135–146)
Total Bilirubin: 0.8 mg/dL (ref 0.2–1.2)
Total Protein: 6.8 g/dL (ref 6.1–8.1)
eGFR: 48 mL/min/{1.73_m2} — ABNORMAL LOW (ref >=60)

## 2021-07-09 LAB — CBC WITH DIFFERENTIAL/PLATELET
Absolute Monocytes: 818 cells/uL (ref 200–950)
Basophils Absolute: 29 cells/uL (ref 0–200)
Basophils Relative: 0.4 %
Eosinophils Absolute: 73 cells/uL (ref 15–500)
Eosinophils Relative: 1 %
HCT: 36.7 % — ABNORMAL LOW (ref 38.5–50.0)
Hemoglobin: 12.5 g/dL — ABNORMAL LOW (ref 13.2–17.1)
Lymphs Abs: 1321 cells/uL (ref 850–3900)
MCH: 29.4 pg (ref 27.0–33.0)
MCHC: 34.1 g/dL (ref 32.0–36.0)
MCV: 86.4 fL (ref 80.0–100.0)
MPV: 10.1 fL (ref 7.5–12.5)
Monocytes Relative: 11.2 %
Neutro Abs: 5059 cells/uL (ref 1500–7800)
Neutrophils Relative %: 69.3 %
Platelets: 215 10*3/uL (ref 140–400)
RBC: 4.25 10*6/uL (ref 4.20–5.80)
RDW: 13.3 % (ref 11.0–15.0)
Total Lymphocyte: 18.1 %
WBC: 7.3 10*3/uL (ref 3.8–10.8)

## 2021-07-09 LAB — LIPID PANEL
Cholesterol: 215 mg/dL — ABNORMAL HIGH (ref <200)
HDL: 65 mg/dL (ref >=40)
LDL Cholesterol (Calc): 122 mg/dL (calc) — ABNORMAL HIGH
Non-HDL Cholesterol (Calc): 150 mg/dL (calc) — ABNORMAL HIGH (ref <130)
Total CHOL/HDL Ratio: 3.3 (calc) (ref <5.0)
Triglycerides: 165 mg/dL — ABNORMAL HIGH (ref <150)

## 2021-07-09 LAB — PSA: PSA: 4.18 ng/mL — ABNORMAL HIGH (ref <=4.00)

## 2021-07-11 NOTE — Telephone Encounter (Signed)
[place] called and spoke to [place], [facility] about the refill(s) hctz and losartan '50mg'$  requested. Advised it was sent on 07/08/21 #90/3 refill(s). He states that they were filled and picked up already. Will refuse this request.  ? ?Requested Prescriptions  ?Pending Prescriptions Disp Refills  ? hydrochlorothiazide (HYDRODIURIL) 25 MG tablet [Pharmacy Med Name: HYDROCHLOROTHIAZIDE '25MG'$  TABLETS] 90 tablet 3  ?  Sig: TAKE 1 TABLET(25 MG) BY MOUTH DAILY  ?  ? Cardiovascular: Diuretics - Thiazide Failed - 07/09/2021  7:03 AM  ?  ?  Failed - Cr in normal range and within 180 days  ?  Creat  ?Date Value Ref Range Status  ?07/08/2021 1.46 (H) 0.70 - 1.22 mg/dL Final  ?  ?  ?  ?  Failed - Na in normal range and within 180 days  ?  Sodium  ?Date Value Ref Range Status  ?07/08/2021 131 (L) 135 - 146 mmol/L Final  ?  ?  ?  ?  Passed - K in normal range and within 180 days  ?  Potassium  ?Date Value Ref Range Status  ?07/08/2021 4.0 3.5 - 5.3 mmol/L Final  ?  ?  ?  ?  Passed - Last BP in normal range  ?  BP Readings from Last 1 Encounters:  ?07/08/21 128/78  ?  ?  ?  ?  Passed - Valid encounter within last 6 months  ?  Recent Outpatient Visits   ? ?      ? 3 days ago Benign hypertension with CKD (chronic kidney disease) stage III ([place])  ? [place], DO  ? 5 months ago Bilateral impacted cerumen  ? [place], DO  ? 7 months ago Benign hypertension with CKD (chronic kidney disease) stage III ([place])  ? [place], DO  ? 9 months ago PTSD (post-traumatic stress disorder)  ? [place], DO  ? 1 year ago Neck pain  ? [place], DO  ? ?  ?  ? ?  ?  ?  ? losartan (COZAAR) 25 MG tablet [Pharmacy Med Name: LOSARTAN '25MG'$  TABLETS] 90 tablet 0  ?  Sig: TAKE 1 TABLET(25 MG) BY MOUTH DAILY  ?  ? Cardiovascular:  Angiotensin Receptor  Blockers Failed - 07/09/2021  7:03 AM  ?  ?  Failed - Cr in normal range and within 180 days  ?  Creat  ?Date Value Ref Range Status  ?07/08/2021 1.46 (H) 0.70 - 1.22 mg/dL Final  ?  ?  ?  ?  Passed - K in normal range and within 180 days  ?  Potassium  ?Date Value Ref Range Status  ?07/08/2021 4.0 3.5 - 5.3 mmol/L Final  ?  ?  ?  ?  Passed - Patient is not pregnant  ?  ?  Passed - Last BP in normal range  ?  BP Readings from Last 1 Encounters:  ?07/08/21 128/78  ?  ?  ?  ?  Passed - Valid encounter within last 6 months  ?  Recent Outpatient Visits   ? ?      ? 3 days ago Benign hypertension with CKD (chronic kidney disease) stage III ([place])  ? [place], DO  ? 5 months ago Bilateral impacted cerumen  ? [facility], Devonne Doughty, DO  ?  7 months ago Benign hypertension with CKD (chronic kidney disease) stage III (HCC)  ? [place], DO  ? 9 months ago PTSD (post-traumatic stress disorder)  ? [place], DO  ? 1 year ago Neck pain  ? [place], DO  ? ?  ?  ? ?  ?  ?  ? ? ? ?

## 2021-08-02 ENCOUNTER — Telehealth: Payer: Self-pay | Admitting: Family Medicine

## 2021-08-02 NOTE — Telephone Encounter (Signed)
Left message for patient to call back and schedule Medicare Annual Wellness Visit (AWV) to be done virtually or by telephone.  No hx of AWV eligible as of 04/24/09  Please schedule at anytime with [facility] Health Advisor.        Any questions, please call me at [phone] 

## 2021-12-30 IMAGING — DX DG FINGER LITTLE 2+V*L*
5 series · 5 of 5 positions shown · non-contrast
Comparison: None.

CLINICAL DATA: Left fifth finger injury after fall.

EXAM:
LEFT LITTLE FINGER 2+V

[finger ap (1 of 4)]
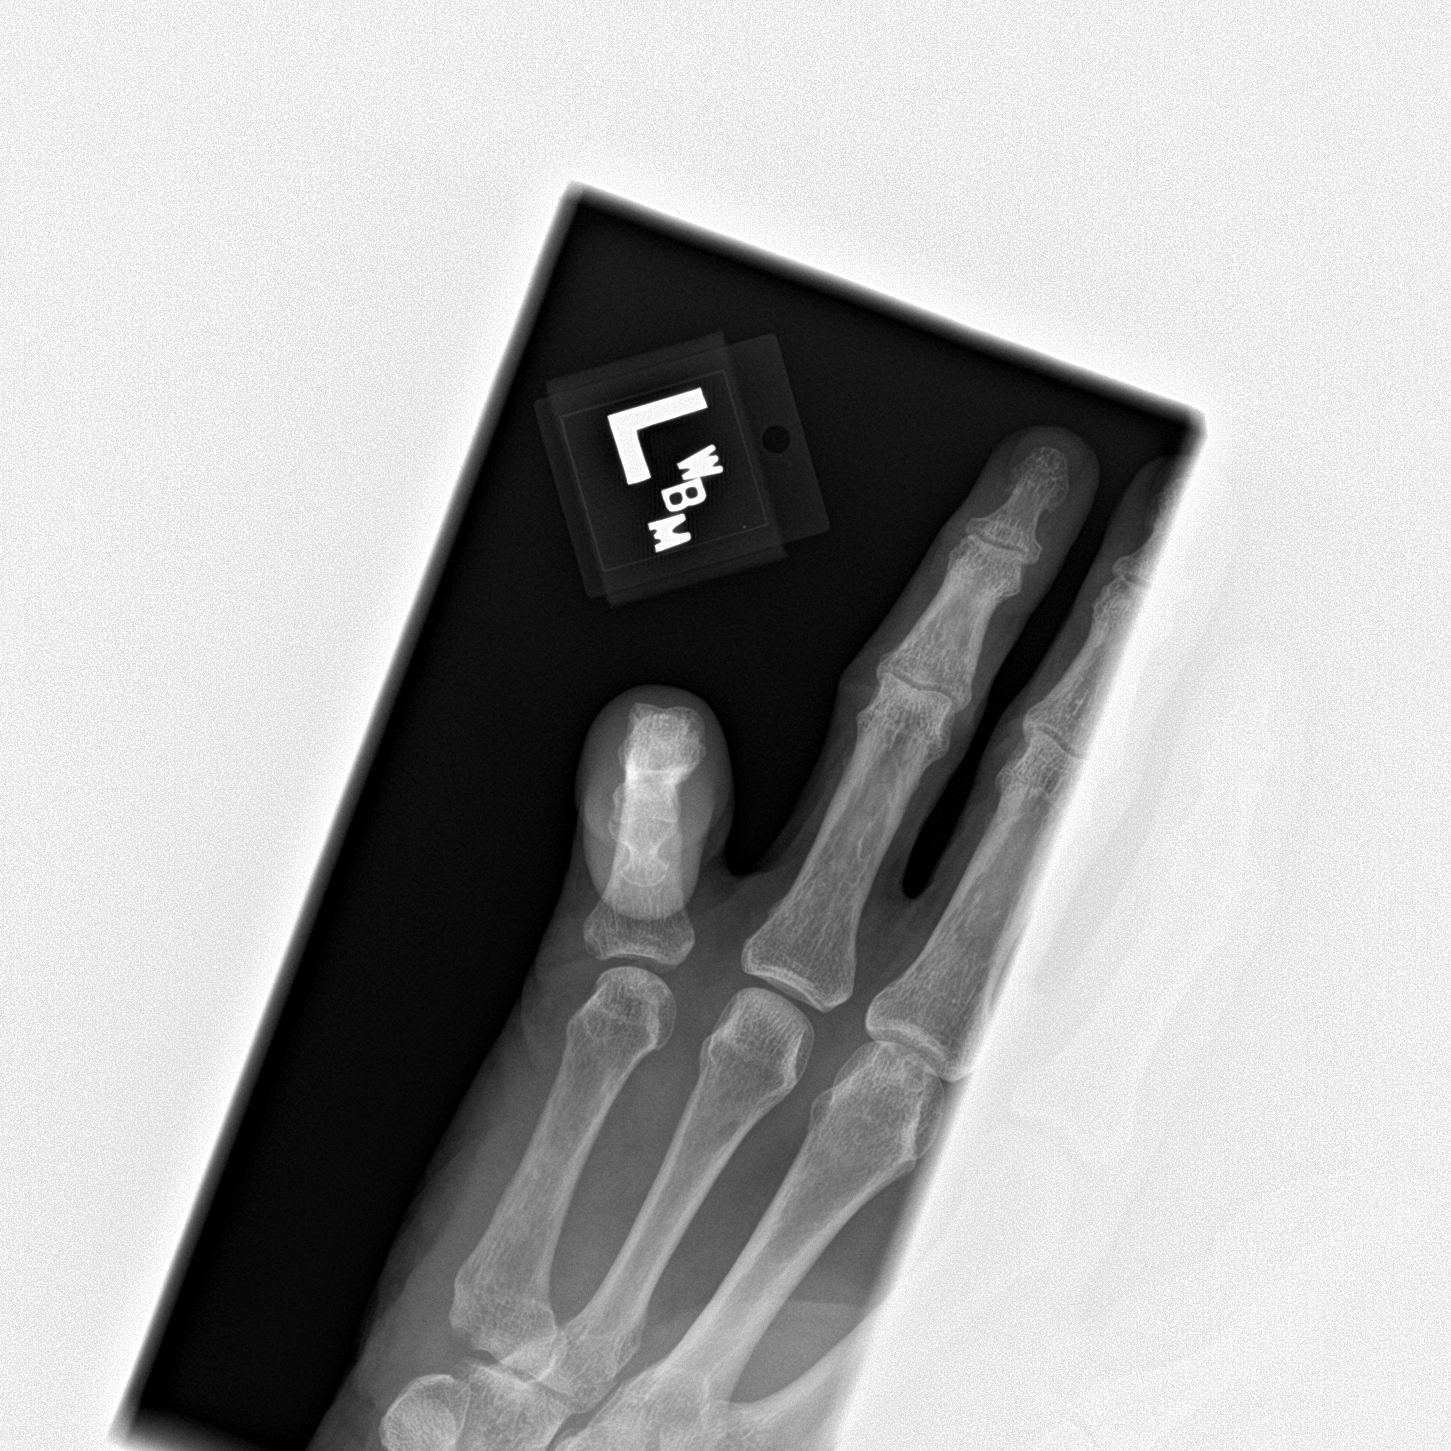

[finger lat]
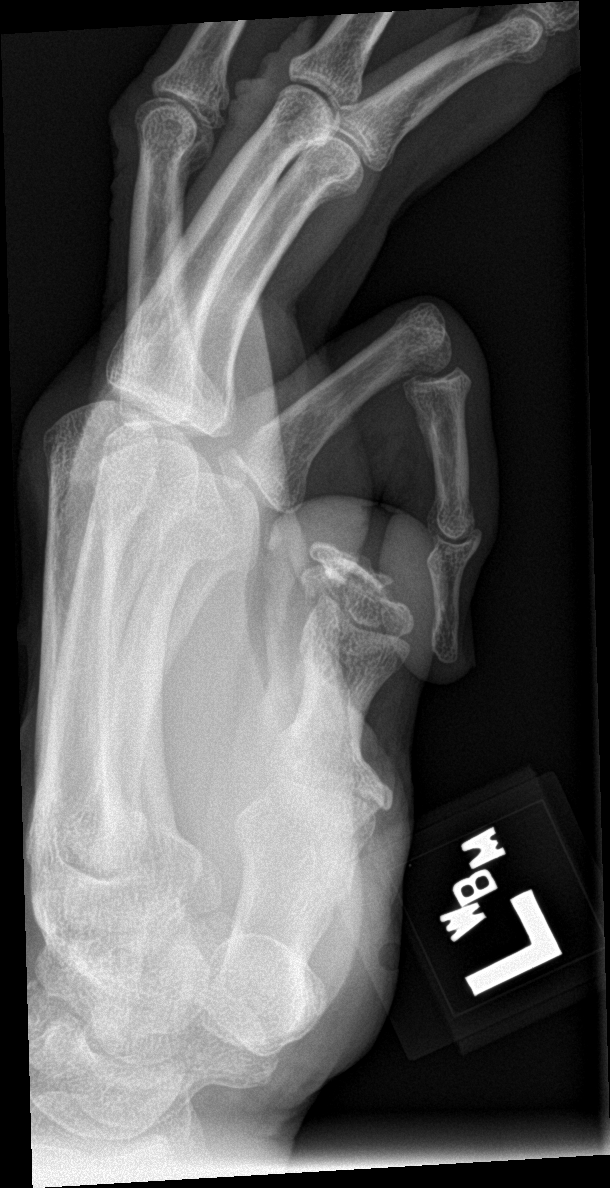

[finger ap (2 of 4)]
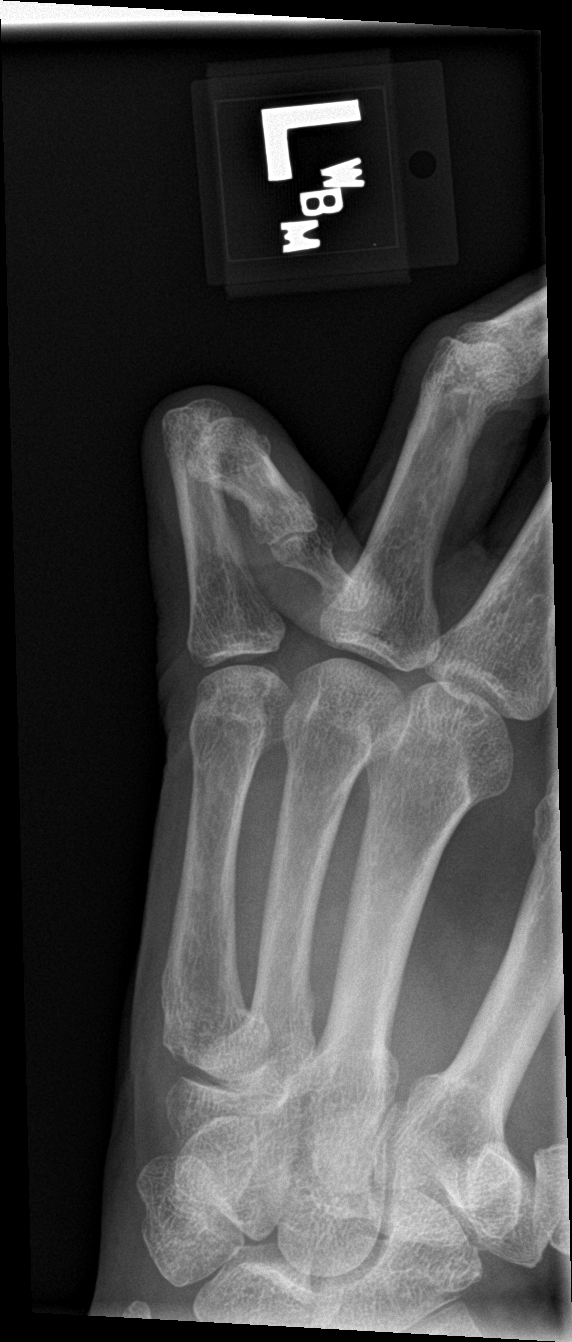

[finger ap (3 of 4)]
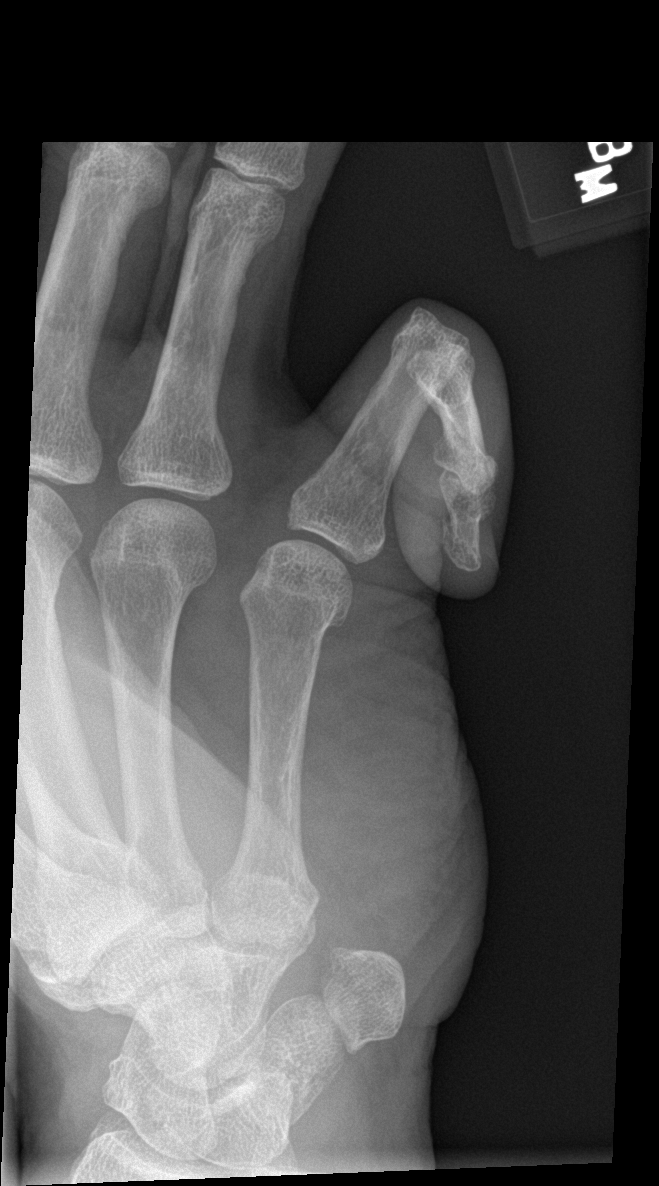

[finger ap (4 of 4)]
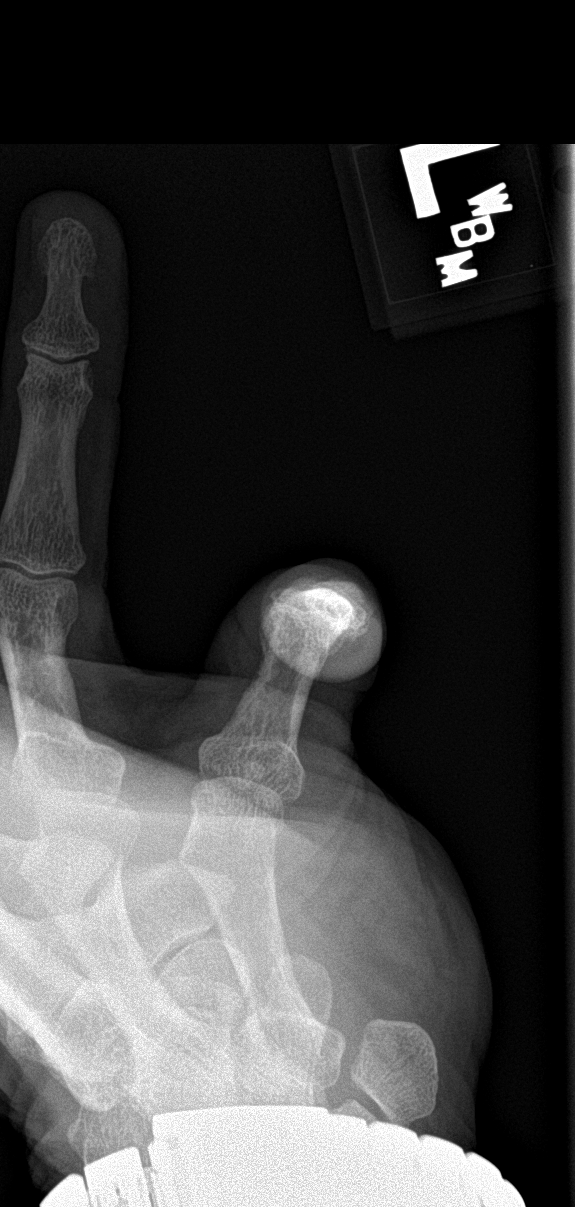

[5 of 5 positions shown; findings below may reference images not displayed]

FINDINGS: There is no evidence of fracture or dislocation. There is no
evidence of arthropathy. Severe flexion of the fifth proximal
interphalangeal joint is noted which may be chronic per given
history. Soft tissues are unremarkable.
IMPRESSION: No definite acute abnormality is noted.

## 2022-03-06 ENCOUNTER — Encounter: Payer: Self-pay | Admitting: Family Medicine

## 2022-03-06 ENCOUNTER — Other Ambulatory Visit: Payer: Self-pay | Admitting: Family Medicine

## 2022-03-06 ENCOUNTER — Ambulatory Visit (INDEPENDENT_AMBULATORY_CARE_PROVIDER_SITE_OTHER): Payer: 59 | Admitting: Family Medicine

## 2022-03-06 VITALS — BP 128/81 | HR 78 | Ht 70.0 in | Wt 146.0 lb

## 2022-03-06 DIAGNOSIS — R229 Localized swelling, mass and lump, unspecified: Secondary | ICD-10-CM | POA: Diagnosis not present

## 2022-03-06 DIAGNOSIS — K219 Gastro-esophageal reflux disease without esophagitis: Secondary | ICD-10-CM | POA: Diagnosis not present

## 2022-03-06 DIAGNOSIS — R234 Changes in skin texture: Secondary | ICD-10-CM

## 2022-03-06 MED ORDER — MUPIROCIN 2 % EX OINT: 1.0000 | TOPICAL_OINTMENT | Freq: Two times a day (BID) | CUTANEOUS | 0 refills | Status: AC

## 2022-03-06 MED ORDER — OMEPRAZOLE 40 MG PO CPDR: 40.0000 mg | DELAYED_RELEASE_CAPSULE | Freq: Every day | ORAL | 3 refills | Status: DC

## 2022-03-06 MED ORDER — SUCRALFATE 1 G PO TABS: 1.0000 g | ORAL_TABLET | Freq: Three times a day (TID) | ORAL | 1 refills | Status: DC

## 2022-03-06 NOTE — Patient Instructions (Addendum)
Thank you for coming to the office today.  Start the Mupirocin topical antibiotic twice a day for 7-10 days on the spots under arm. You can use ice packs if they remain swollen and painful.  For stomach acid  Recommend Omeprazole '40mg'$  dose first thing in morning before first meal, every day for next several months. This should help reduce stomach acid  Also can take Carafate (sucralfate) as needed with meals if you are bothered by acid reflux.   Please schedule a Follow-up Appointment to: Return in about 4 months (around 07/05/2022) for 4 month Annual Physical AM apt with fasting lab AFTER.  If you have any other questions or concerns, please feel free to call the office or send a message through [place]. You may also schedule an earlier appointment if necessary.  Additionally, you may be receiving a survey about your experience at our office within a few days to 1 week by e-mail or mail. We value your feedback.  [name], DO [place]

## 2022-03-06 NOTE — Progress Notes (Signed)
Subjective:    Patient ID: Alexander Rowe, male    DOB: 06/27/1940, 81 y.o.   MRN: [id number]  Alexander Rowe is a 81 y.o. male presenting on 03/06/2022 for Cyst and Gastroesophageal Reflux   HPI  Right Axillary Nodular indurated skin lesions X 2 distinct swollen spots 1 cm or less in R axillary region, he noticed swollen and tender before, no drainage. Not using medicine on them. He has dermatologist but they haven't seen tese  GERD Reports feeling epigastric symptoms, worse after eating and at night. Feels food gets stuck. On Omeprazole 44m but not regularly  History of Skin Cancer SCC Multiple skin lesions, needs to return to Dermatology He has areas of non healing on skin, mostly ears and face He has had reconstruction on his ears Scabs that are not healing, dry skin.   CHRONIC HTN: Reports no concerns, controlled Current Meds - Losartan 519mdaily and HCTZ 2557maily   Reports good compliance, took meds today. Tolerating well, w/o complaints. Denies CP, dyspnea, HA, edema, dizziness / lightheadedness         07/08/2021    2:38 PM 12/31/2019    1:26 PM 09/19/2019   11:28 AM  Depression screen PHQ 2/9  Decreased Interest 0 0 0  Down, Depressed, Hopeless 0 0 0  PHQ - 2 Score 0 0 0  Altered sleeping 0    Tired, decreased energy 0    Change in appetite 0    Feeling bad or failure about yourself  0    Trouble concentrating 0    Moving slowly or fidgety/restless 0    Suicidal thoughts 0    PHQ-9 Score 0    Difficult doing work/chores Not difficult at all      Social History   Tobacco Use   Smoking status: Every Day    Packs/day: 1.00    Years: 50.00    Total pack years: 50.00    Types: Cigarettes   Smokeless tobacco: Current  Vaping Use   Vaping Use: Never used  Substance Use Topics   Alcohol use: Yes    Comment: couple beers weekends   Drug use: No    Review of Systems Per HPI unless specifically indicated above     Objective:    BP 128/81    Pulse 78   Ht _0  (1.778 m)   Wt 146 lb (66.2 kg)   SpO2 97%   BMI 20.95 kg/m   Wt Readings from Last 3 Encounters:  03/06/22 146 lb (66.2 kg)  07/08/21 145 lb 12.8 oz (66.1 kg)  02/03/21 143 lb (64.9 kg)    Physical Exam Vitals and nursing note reviewed.  Constitutional:      General: He is not in acute distress.    Appearance: He is well-developed. He is not diaphoretic.     Comments: Well-appearing, comfortable, cooperative  HENT:     Head: Normocephalic and atraumatic.  Eyes:     General:        Right eye: No discharge.        Left eye: No discharge.     Conjunctiva/sclera: Conjunctivae normal.  Neck:     Thyroid: No thyromegaly.  Cardiovascular:     Rate and Rhythm: Normal rate and regular rhythm.     Pulses: Normal pulses.     Heart sounds: Normal heart sounds. No murmur heard. Pulmonary:     Effort: Pulmonary effort is normal. No respiratory distress.     Breath  sounds: Normal breath sounds. No wheezing or rales.  Musculoskeletal:        General: Normal range of motion.     Cervical back: Normal range of motion and neck supple.  Lymphadenopathy:     Cervical: No cervical adenopathy.  Skin:    General: Skin is warm and dry.     Findings: Lesion (x 2 indurated non erythematous spots 1 cm R axillary region. no ulceratio nor drainage, no fluctuance. other large fleshy skin tags on flank abdomen) present. No erythema or rash.  Neurological:     Mental Status: He is alert and oriented to person, place, and time. Mental status is at baseline.  Psychiatric:        Behavior: Behavior normal.     Comments: Well groomed, good eye contact, normal speech and thoughts      Results for orders placed or performed in visit on 07/08/21  COMPLETE METABOLIC PANEL WITH GFR  Result Value Ref Range   Glucose, Bld 100 65 - 139 mg/dL   BUN 23 7 - 25 mg/dL   Creat 1.46 (H) 0.70 - 1.22 mg/dL   eGFR 48 (L) > OR = 60 mL/min/1.34m   BUN/Creatinine Ratio 16 6 - 22 (calc)    Sodium 131 (L) 135 - 146 mmol/L   Potassium 4.0 3.5 - 5.3 mmol/L   Chloride 94 (L) 98 - 110 mmol/L   CO2 29 20 - 32 mmol/L   Calcium 9.9 8.6 - 10.3 mg/dL   Total Protein 6.8 6.1 - 8.1 g/dL   Albumin 4.5 3.6 - 5.1 g/dL   Globulin 2.3 1.9 - 3.7 g/dL (calc)   AG Ratio 2.0 1.0 - 2.5 (calc)   Total Bilirubin 0.8 0.2 - 1.2 mg/dL   Alkaline phosphatase (APISO) 47 35 - 144 U/L   AST 16 10 - 35 U/L   ALT 11 9 - 46 U/L  CBC with Differential/Platelet  Result Value Ref Range   WBC 7.3 3.8 - 10.8 Thousand/uL   RBC 4.25 4.20 - 5.80 Million/uL   Hemoglobin 12.5 (L) 13.2 - 17.1 g/dL   HCT 36.7 (L) 38.5 - 50.0 %   MCV 86.4 80.0 - 100.0 fL   MCH 29.4 27.0 - 33.0 pg   MCHC 34.1 32.0 - 36.0 g/dL   RDW 13.3 11.0 - 15.0 %   Platelets 215 140 - 400 Thousand/uL   MPV 10.1 7.5 - 12.5 fL   Neutro Abs 5,059 1,500 - 7,800 cells/uL   Lymphs Abs 1,321 850 - 3,900 cells/uL   Absolute Monocytes 818 200 - 950 cells/uL   Eosinophils Absolute 73 15 - 500 cells/uL   Basophils Absolute 29 0 - 200 cells/uL   Neutrophils Relative % 69.3 %   Total Lymphocyte 18.1 %   Monocytes Relative 11.2 %   Eosinophils Relative 1.0 %   Basophils Relative 0.4 %  Lipid panel  Result Value Ref Range   Cholesterol 215 (H) <200 mg/dL   HDL 65 > OR = 40 mg/dL   Triglycerides 165 (H) <150 mg/dL   LDL Cholesterol (Calc) 122 (H) mg/dL (calc)   Total CHOL/HDL Ratio 3.3 <5.0 (calc)   Non-HDL Cholesterol (Calc) 150 (H) <130 mg/dL (calc)  PSA  Result Value Ref Range   PSA 4.18 (H) < OR = 4.00 ng/mL      Assessment & Plan:   Problem List Items Addressed This Visit   None Visit Diagnoses     Skin nodule    -  Primary  Skin induration       Relevant Medications   mupirocin ointment (BACTROBAN) 2 %   Gastroesophageal reflux disease without esophagitis       Relevant Medications   omeprazole (PRILOSEC) 40 MG capsule   sucralfate (CARAFATE) 1 g tablet       Indurated spots axillary No obvious abscess or other  infection Start the Mupirocin topical antibiotic twice a day for 7-10 days on the spots under arm. You can use ice packs if they remain swollen and painful.  GERD Recommend Omeprazole 6m dose first thing in morning before first meal, every day for next several months. This should help reduce stomach acid  Also can take Carafate (sucralfate) as needed with meals if you are bothered by acid reflux.  If not improving would refer to GI for EGD  Meds ordered this encounter  Medications   omeprazole (PRILOSEC) 40 MG capsule    Sig: Take 1 capsule (40 mg total) by mouth daily before breakfast.    Dispense:  90 capsule    Refill:  3   mupirocin ointment (BACTROBAN) 2 %    Sig: Apply 1 Application topically 2 (two) times daily. For 7-10 days on skin lesions with swelling, redness, infection.    Dispense:  22 g    Refill:  0   sucralfate (CARAFATE) 1 g tablet    Sig: Take 1 tablet (1 g total) by mouth 4 (four) times daily -  with meals and at bedtime. As needed for acid reflux indigestion    Dispense:  90 tablet    Refill:  1      Follow up plan: Return in about 4 months (around 07/05/2022) for 4 month Annual Physical AM apt with fasting lab AFTER.   [facility] 03/06/2022, 2:29 PM

## 2022-03-07 NOTE — Telephone Encounter (Signed)
Unable to refill per protocol, Rx request was refilled by PCP 03/06/22 for 90 and 1. Will refuse the request.  Requested Prescriptions  Pending Prescriptions Disp Refills   sucralfate (CARAFATE) 1 g tablet [Pharmacy Med Name: SUCRALFATE 1GM TABLETS] 368 tablet     Sig: TAKE 1 TABLET(1 GRAM) BY MOUTH FOUR TIMES DAILY WITH MEALS AND AT BEDTIMES AS NEEDED FOR STOMACH ACID OR REFLUX OR INDIGESTION     Gastroenterology: Antiacids Passed - 03/06/2022  3:18 PM      Passed - Valid encounter within last 12 months    Recent Outpatient Visits           Yesterday Skin nodule   [facility] Olin Hauser, DO   8 months ago Benign hypertension with CKD (chronic kidney disease) stage III [facility])   [place], DO   1 year ago Bilateral impacted cerumen   [place], DO   1 year ago Benign hypertension with CKD (chronic kidney disease) stage III [facility])   [facility] Olin Hauser, DO   1 year ago PTSD (post-traumatic stress disorder)   [facility] [place], Devonne Doughty, DO       Future Appointments             In 4 months [place], Devonne Doughty, DO [facility] [place], [facility]

## 2022-04-20 ENCOUNTER — Telehealth: Payer: Self-pay | Admitting: Family Medicine

## 2022-04-20 NOTE — Telephone Encounter (Signed)
Left message for patient to call back and schedule Medicare Annual Wellness Visit (AWV) to be done virtually or by telephone.  No hx of AWV eligible as of 04/24/09  Please schedule at anytime with [facility].      16 Minutes appointment   Any questions, please call me at [phone]

## 2022-07-05 ENCOUNTER — Other Ambulatory Visit: Payer: 59

## 2022-07-05 ENCOUNTER — Other Ambulatory Visit: Payer: Self-pay

## 2022-07-05 DIAGNOSIS — R7309 Other abnormal glucose: Secondary | ICD-10-CM

## 2022-07-05 DIAGNOSIS — R972 Elevated prostate specific antigen [PSA]: Secondary | ICD-10-CM

## 2022-07-05 DIAGNOSIS — Z0000 Encounter for general adult medical examination without abnormal findings: Secondary | ICD-10-CM

## 2022-07-05 DIAGNOSIS — E782 Mixed hyperlipidemia: Secondary | ICD-10-CM

## 2022-07-06 LAB — LIPID PANEL
Cholesterol: 192 mg/dL (ref <200)
HDL: 67 mg/dL (ref >=40)
LDL Cholesterol (Calc): 108 mg/dL (calc) — ABNORMAL HIGH
Non-HDL Cholesterol (Calc): 125 mg/dL (calc) (ref <130)
Total CHOL/HDL Ratio: 2.9 (calc) (ref <5.0)
Triglycerides: 76 mg/dL (ref <150)

## 2022-07-06 LAB — CBC WITH DIFFERENTIAL/PLATELET
Absolute Monocytes: 683 cells/uL (ref 200–950)
Basophils Absolute: 54 cells/uL (ref 0–200)
Basophils Relative: 0.8 %
Eosinophils Absolute: 114 cells/uL (ref 15–500)
Eosinophils Relative: 1.7 %
HCT: 35.7 % — ABNORMAL LOW (ref 38.5–50.0)
Hemoglobin: 11.8 g/dL — ABNORMAL LOW (ref 13.2–17.1)
Lymphs Abs: 1762 cells/uL (ref 850–3900)
MCH: 28.1 pg (ref 27.0–33.0)
MCHC: 33.1 g/dL (ref 32.0–36.0)
MCV: 85 fL (ref 80.0–100.0)
MPV: 9.7 fL (ref 7.5–12.5)
Monocytes Relative: 10.2 %
Neutro Abs: 4087 cells/uL (ref 1500–7800)
Neutrophils Relative %: 61 %
Platelets: 254 10*3/uL (ref 140–400)
RBC: 4.2 10*6/uL (ref 4.20–5.80)
RDW: 13.7 % (ref 11.0–15.0)
Total Lymphocyte: 26.3 %
WBC: 6.7 10*3/uL (ref 3.8–10.8)

## 2022-07-06 LAB — HEMOGLOBIN A1C
Hgb A1c MFr Bld: 5.8 % of total Hgb — ABNORMAL HIGH (ref <5.7)
Mean Plasma Glucose: 120 mg/dL
eAG (mmol/L): 6.6 mmol/L

## 2022-07-06 LAB — COMPREHENSIVE METABOLIC PANEL
AG Ratio: 1.7 (calc) (ref 1.0–2.5)
ALT: 9 U/L (ref 9–46)
AST: 11 U/L (ref 10–35)
Albumin: 4.1 g/dL (ref 3.6–5.1)
Alkaline phosphatase (APISO): 56 U/L (ref 35–144)
BUN/Creatinine Ratio: 21 (calc) (ref 6–22)
BUN: 28 mg/dL — ABNORMAL HIGH (ref 7–25)
CO2: 27 mmol/L (ref 20–32)
Calcium: 9.8 mg/dL (ref 8.6–10.3)
Chloride: 100 mmol/L (ref 98–110)
Creat: 1.33 mg/dL — ABNORMAL HIGH (ref 0.70–1.22)
Globulin: 2.4 g/dL (calc) (ref 1.9–3.7)
Glucose, Bld: 94 mg/dL (ref 65–139)
Potassium: 4.3 mmol/L (ref 3.5–5.3)
Sodium: 136 mmol/L (ref 135–146)
Total Bilirubin: 0.5 mg/dL (ref 0.2–1.2)
Total Protein: 6.5 g/dL (ref 6.1–8.1)

## 2022-07-06 LAB — PSA: PSA: 4.21 ng/mL — ABNORMAL HIGH (ref <=4.00)

## 2022-07-12 ENCOUNTER — Encounter: Payer: 59 | Admitting: Family Medicine

## 2022-07-25 ENCOUNTER — Other Ambulatory Visit: Payer: Self-pay | Admitting: Family Medicine

## 2022-07-25 DIAGNOSIS — I129 Hypertensive chronic kidney disease with stage 1 through stage 4 chronic kidney disease, or unspecified chronic kidney disease: Secondary | ICD-10-CM

## 2022-07-25 NOTE — Telephone Encounter (Signed)
Requested medication (s) are due for refill today - expired Rx  Requested medication (s) are on the active medication list -yes  Future visit scheduled -no  Last refill: 07/08/21 #90 3RF  Notes to clinic: expired Rx  Requested Prescriptions  Pending Prescriptions Disp Refills   losartan (COZAAR) 50 MG tablet [Pharmacy Med Name: LOSARTAN 50MG  TABLETS] 90 tablet 3    Sig: TAKE 1 TABLET(50 MG) BY MOUTH DAILY     Cardiovascular:  Angiotensin Receptor Blockers Failed - 07/25/2022  7:01 AM      Failed - Cr in normal range and within 180 days    Creat  Date Value Ref Range Status  07/05/2022 1.33 (H) 0.70 - 1.22 mg/dL Final         Passed - K in normal range and within 180 days    Potassium  Date Value Ref Range Status  07/05/2022 4.3 3.5 - 5.3 mmol/L Final         Passed - Patient is not pregnant      Passed - Last BP in normal range    BP Readings from Last 1 Encounters:  03/06/22 128/81         Passed - Valid encounter within last 6 months    Recent Outpatient Visits           4 months ago Skin nodule   [place], DO   1 year ago Benign hypertension with CKD (chronic kidney disease) stage III [facility])   [facility] [place], Devonne Doughty, DO   1 year ago Bilateral impacted cerumen   [facility] Olin Hauser, DO   1 year ago Benign hypertension with CKD (chronic kidney disease) stage III [facility])   [facility] [place], Devonne Doughty, DO   1 year ago PTSD (post-traumatic stress disorder)   [facility] Olin Hauser, DO                 Requested Prescriptions  Pending Prescriptions Disp Refills   losartan (COZAAR) 50 MG tablet [Pharmacy Med Name: LOSARTAN 50MG  TABLETS] 90 tablet 3    Sig: TAKE 1 TABLET(50 MG) BY MOUTH DAILY     Cardiovascular:  Angiotensin Receptor Blockers  Failed - 07/25/2022  7:01 AM      Failed - Cr in normal range and within 180 days    Creat  Date Value Ref Range Status  07/05/2022 1.33 (H) 0.70 - 1.22 mg/dL Final         Passed - K in normal range and within 180 days    Potassium  Date Value Ref Range Status  07/05/2022 4.3 3.5 - 5.3 mmol/L Final         Passed - Patient is not pregnant      Passed - Last BP in normal range    BP Readings from Last 1 Encounters:  03/06/22 128/81         Passed - Valid encounter within last 6 months    Recent Outpatient Visits           4 months ago Skin nodule   [place], DO   1 year ago Benign hypertension with CKD (chronic kidney disease) stage III [facility])   [place], DO   1 year ago Bilateral impacted cerumen  [facility] Olin Hauser, DO   1 year ago Benign hypertension with CKD (chronic kidney disease) stage III [facility])   [facility] Olin Hauser, DO   1 year ago PTSD (post-traumatic stress disorder)   [place], DO

## 2022-10-15 ENCOUNTER — Other Ambulatory Visit: Payer: Self-pay | Admitting: Family Medicine

## 2022-10-15 DIAGNOSIS — N183 Chronic kidney disease, stage 3 unspecified: Secondary | ICD-10-CM

## 2022-10-16 ENCOUNTER — Other Ambulatory Visit: Payer: Self-pay | Admitting: Family Medicine

## 2022-10-16 DIAGNOSIS — N183 Chronic kidney disease, stage 3 unspecified: Secondary | ICD-10-CM

## 2022-10-16 NOTE — Telephone Encounter (Signed)
Requested Prescriptions  Pending Prescriptions Disp Refills   hydrochlorothiazide (HYDRODIURIL) 25 MG tablet [Pharmacy Med Name: HYDROCHLOROTHIAZIDE 25MG  TABLETS] 30 tablet 0    Sig: TAKE 1 TABLET(25 MG) BY MOUTH DAILY     Cardiovascular: Diuretics - Thiazide Failed - 10/15/2022  7:03 AM      Failed - Cr in normal range and within 180 days    Creat  Date Value Ref Range Status  07/05/2022 1.33 (H) 0.70 - 1.22 mg/dL Final         Failed - Valid encounter within last 6 months    Recent Outpatient Visits           7 months ago Skin nodule   [place] [facility] Smitty Cords, DO   1 year ago Benign hypertension with CKD (chronic kidney disease) stage III [facility])   [facility] Smitty Cords, DO   1 year ago Bilateral impacted cerumen   [place] [facility] Smitty Cords, DO   1 year ago Benign hypertension with CKD (chronic kidney disease) stage III [facility])   [place] [facility] Smitty Cords, DO   2 years ago PTSD (post-traumatic stress disorder)   [facility] [place], Netta Neat, DO       Future Appointments             In 1 week Althea Charon, Netta Neat, DO [place] [facility]            Passed - K in normal range and within 180 days    Potassium  Date Value Ref Range Status  07/05/2022 4.3 3.5 - 5.3 mmol/L Final         Passed - Na in normal range and within 180 days    Sodium  Date Value Ref Range Status  07/05/2022 136 135 - 146 mmol/L Final         Passed - Last BP in normal range    BP Readings from Last 1 Encounters:  03/06/22 128/81

## 2022-10-17 NOTE — Telephone Encounter (Signed)
Unable to refill per protocol, last refilled 10/16/22, duplicate request.E-Prescribing Status: Receipt confirmed by pharmacy (10/16/2022  5:45 PM EDT).  Requested Prescriptions  Pending Prescriptions Disp Refills   hydrochlorothiazide (HYDRODIURIL) 25 MG tablet [Pharmacy Med Name: HYDROCHLOROTHIAZIDE 25MG  TABLETS] 90 tablet     Sig: TAKE 1 TABLET(25 MG) BY MOUTH DAILY     Cardiovascular: Diuretics - Thiazide Failed - 10/16/2022  5:45 PM      Failed - Cr in normal range and within 180 days    Creat  Date Value Ref Range Status  07/05/2022 1.33 (H) 0.70 - 1.22 mg/dL Final         Failed - Valid encounter within last 6 months    Recent Outpatient Visits           7 months ago Skin nodule   [place] [facility] Smitty Cords, DO   1 year ago Benign hypertension with CKD (chronic kidney disease) stage III [facility])   [place] [facility] Smitty Cords, DO   1 year ago Bilateral impacted cerumen   [place] [facility] Smitty Cords, DO   1 year ago Benign hypertension with CKD (chronic kidney disease) stage III [facility])   [place] [facility] Smitty Cords, DO   2 years ago PTSD (post-traumatic stress disorder)   [place] [facility] [place], Netta Neat, DO       Future Appointments             In 1 week Althea Charon, Netta Neat, DO [place] [facility]            Passed - K in normal range and within 180 days    Potassium  Date Value Ref Range Status  07/05/2022 4.3 3.5 - 5.3 mmol/L Final         Passed - Na in normal range and within 180 days    Sodium  Date Value Ref Range Status  07/05/2022 136 135 - 146 mmol/L Final         Passed - Last BP in normal range    BP Readings from Last 1 Encounters:  03/06/22 128/81

## 2022-10-27 ENCOUNTER — Encounter: Payer: Self-pay | Admitting: Family Medicine

## 2022-10-27 ENCOUNTER — Ambulatory Visit (INDEPENDENT_AMBULATORY_CARE_PROVIDER_SITE_OTHER): Payer: 59 | Admitting: Family Medicine

## 2022-10-27 VITALS — BP 126/82 | HR 75 | Temp 97.1°F | Wt 141.0 lb

## 2022-10-27 DIAGNOSIS — I129 Hypertensive chronic kidney disease with stage 1 through stage 4 chronic kidney disease, or unspecified chronic kidney disease: Secondary | ICD-10-CM | POA: Diagnosis not present

## 2022-10-27 DIAGNOSIS — J432 Centrilobular emphysema: Secondary | ICD-10-CM | POA: Diagnosis not present

## 2022-10-27 DIAGNOSIS — K219 Gastro-esophageal reflux disease without esophagitis: Secondary | ICD-10-CM

## 2022-10-27 DIAGNOSIS — N183 Chronic kidney disease, stage 3 unspecified: Secondary | ICD-10-CM

## 2022-10-27 DIAGNOSIS — Z9189 Other specified personal risk factors, not elsewhere classified: Secondary | ICD-10-CM

## 2022-10-27 MED ORDER — HYDROCHLOROTHIAZIDE 25 MG PO TABS: 25.0000 mg | ORAL_TABLET | Freq: Every day | ORAL | 3 refills | Status: DC

## 2022-10-27 MED ORDER — LOSARTAN POTASSIUM 50 MG PO TABS: 50.0000 mg | ORAL_TABLET | Freq: Every day | ORAL | 3 refills | Status: DC

## 2022-10-27 MED ORDER — OMEPRAZOLE 40 MG PO CPDR: 40.0000 mg | DELAYED_RELEASE_CAPSULE | Freq: Every day | ORAL | 3 refills | Status: DC

## 2022-10-27 MED ORDER — AZITHROMYCIN 250 MG PO TABS
ORAL_TABLET | ORAL | 0 refills | Status: DC
Start: 2022-10-27 — End: 2023-01-08

## 2022-10-27 NOTE — Progress Notes (Signed)
Subjective:    Patient ID: Alexander Rowe, male    DOB: [birth year], 82 y.o.   MRN: [id number]  Alexander Rowe is a 82 y.o. male presenting on 10/27/2022 for Hypertension   HPI  Hearing Loss Followed by ENT   History of Skin Cancer SCC Multiple skin lesions, followed by Dermatology He has areas of non healing on skin, mostly ears and face He has had reconstruction on his ears   CHRONIC HTN: Reports no concerns, controlled Current Meds - Losartan 50mg  daily and HCTZ 25mg  daily   Reports good compliance, took meds today. Tolerating well, w/o complaints. Denies CP, dyspnea, HA, edema, dizziness / lightheadedness  Chronic Bronchitis Tobacco Smoker Recent flare productive cough       07/08/2021    2:38 PM 12/31/2019    1:26 PM 09/19/2019   11:28 AM  Depression screen PHQ 2/9  Decreased Interest 0 0 0  Down, Depressed, Hopeless 0 0 0  PHQ - 2 Score 0 0 0  Altered sleeping 0    Tired, decreased energy 0    Change in appetite 0    Feeling bad or failure about yourself  0    Trouble concentrating 0    Moving slowly or fidgety/restless 0    Suicidal thoughts 0    PHQ-9 Score 0    Difficult doing work/chores Not difficult at all      Social History   Tobacco Use   Smoking status: Every Day    Packs/day: 1.00    Years: 50.00    Additional pack years: 0.00    Total pack years: 50.00    Types: Cigarettes   Smokeless tobacco: Current  Vaping Use   Vaping Use: Never used  Substance Use Topics   Alcohol use: Yes    Comment: couple beers weekends   Drug use: No    Review of Systems Per HPI unless specifically indicated above     Objective:    BP 126/82 (BP Location: Left Arm, Patient Position: Sitting, Cuff Size: Normal)   Pulse 75   Temp (!) 97.1 F (36.2 C) (Temporal)   Wt 141 lb (64 kg)   SpO2 97%   BMI 20.23 kg/m   Wt Readings from Last 3 Encounters:  10/27/22 141 lb (64 kg)  03/06/22 146 lb (66.2 kg)  07/08/21 145 lb 12.8 oz (66.1 kg)    Physical  Exam Vitals and nursing note reviewed.  Constitutional:      General: He is not in acute distress.    Appearance: He is well-developed. He is not diaphoretic.     Comments: Well-appearing, comfortable, cooperative  HENT:     Head: Normocephalic and atraumatic.  Eyes:     General:        Right eye: No discharge.        Left eye: No discharge.     Conjunctiva/sclera: Conjunctivae normal.  Neck:     Thyroid: No thyromegaly.  Cardiovascular:     Rate and Rhythm: Normal rate and regular rhythm.     Pulses: Normal pulses.     Heart sounds: Normal heart sounds. No murmur heard. Pulmonary:     Effort: Pulmonary effort is normal. No respiratory distress.     Breath sounds: Normal breath sounds. No wheezing or rales.  Musculoskeletal:        General: Normal range of motion.     Cervical back: Normal range of motion and neck supple.  Lymphadenopathy:     Cervical:  No cervical adenopathy.  Skin:    General: Skin is warm and dry.     Findings: No erythema or rash.  Neurological:     Mental Status: He is alert and oriented to person, place, and time. Mental status is at baseline.  Psychiatric:        Behavior: Behavior normal.     Comments: Well groomed, good eye contact, normal speech and thoughts       Results for orders placed or performed in visit on 07/05/22  CBC with Differential  Result Value Ref Range   WBC 6.7 3.8 - 10.8 Thousand/uL   RBC 4.20 4.20 - 5.80 Million/uL   Hemoglobin 11.8 (L) 13.2 - 17.1 g/dL   HCT 40.9 (L) 81.1 - 91.4 %   MCV 85.0 80.0 - 100.0 fL   MCH 28.1 27.0 - 33.0 pg   MCHC 33.1 32.0 - 36.0 g/dL   RDW 78.2 95.6 - 21.3 %   Platelets 254 140 - 400 Thousand/uL   MPV 9.7 7.5 - 12.5 fL   Neutro Abs 4,087 1,500 - 7,800 cells/uL   Lymphs Abs 1,762 850 - 3,900 cells/uL   Absolute Monocytes 683 200 - 950 cells/uL   Eosinophils Absolute 114 15 - 500 cells/uL   Basophils Absolute 54 0 - 200 cells/uL   Neutrophils Relative % 61 %   Total Lymphocyte 26.3 %    Monocytes Relative 10.2 %   Eosinophils Relative 1.7 %   Basophils Relative 0.8 %  Comprehensive Metabolic Panel (CMET)  Result Value Ref Range   Glucose, Bld 94 65 - 139 mg/dL   BUN 28 (H) 7 - 25 mg/dL   Creat 0.86 (H) 5.78 - 1.22 mg/dL   BUN/Creatinine Ratio 21 6 - 22 (calc)   Sodium 136 135 - 146 mmol/L   Potassium 4.3 3.5 - 5.3 mmol/L   Chloride 100 98 - 110 mmol/L   CO2 27 20 - 32 mmol/L   Calcium 9.8 8.6 - 10.3 mg/dL   Total Protein 6.5 6.1 - 8.1 g/dL   Albumin 4.1 3.6 - 5.1 g/dL   Globulin 2.4 1.9 - 3.7 g/dL (calc)   AG Ratio 1.7 1.0 - 2.5 (calc)   Total Bilirubin 0.5 0.2 - 1.2 mg/dL   Alkaline phosphatase (APISO) 56 35 - 144 U/L   AST 11 10 - 35 U/L   ALT 9 9 - 46 U/L  HgB A1c  Result Value Ref Range   Hgb A1c MFr Bld 5.8 (H) <5.7 % of total Hgb   Mean Plasma Glucose 120 mg/dL   eAG (mmol/L) 6.6 mmol/L  PSA  Result Value Ref Range   PSA 4.21 (H) < OR = 4.00 ng/mL  Lipid panel  Result Value Ref Range   Cholesterol 192 <200 mg/dL   HDL 67 > OR = 40 mg/dL   Triglycerides 76 <469 mg/dL   LDL Cholesterol (Calc) 108 (H) mg/dL (calc)   Total CHOL/HDL Ratio 2.9 <5.0 (calc)   Non-HDL Cholesterol (Calc) 125 <130 mg/dL (calc)      Assessment & Plan:   Problem List Items Addressed This Visit     Benign hypertension with CKD (chronic kidney disease) stage III (HCC)   Relevant Medications   hydrochlorothiazide (HYDRODIURIL) 25 MG tablet   losartan (COZAAR) 50 MG tablet   Centrilobular emphysema (HCC) - Primary   Relevant Medications   azithromycin (ZITHROMAX Z-PAK) 250 MG tablet   Other Visit Diagnoses     Streptococcus pneumoniae vaccination indicated  Relevant Orders   Pneumococcal conjugate vaccine 20-valent (Completed)   Gastroesophageal reflux disease without esophagitis       Relevant Medications   omeprazole (PRILOSEC) 40 MG capsule       Received Prevnar-20 today vaccine for pneumonia vaccine  HYPERTENSION Improved control Keep taking  Losartan 50mg  daily Keep Taking Hydrochlorothiazide 25 mg daily Re order, clarified dosing on which meds he has  GERD Due to breakthrough symptoms Restart Omeprazole 40mg  daily before breakfast for stomach acid.  Goal to improve water hydration.  Acute COPD Excaerbation / Bronchitis flare Offer  Start Azithromycin Z pak (antibiotic) 2 tabs day 1, then 1 tab x 4 days, complete entire course even if improved   Meds ordered this encounter  Medications   hydrochlorothiazide (HYDRODIURIL) 25 MG tablet    Sig: Take 1 tablet (25 mg total) by mouth daily.    Dispense:  90 tablet    Refill:  3    Add refills for future   losartan (COZAAR) 50 MG tablet    Sig: Take 1 tablet (50 mg total) by mouth daily.    Dispense:  90 tablet    Refill:  3    Add future refills. Discontinue the Losartan 25mg    azithromycin (ZITHROMAX Z-PAK) 250 MG tablet    Sig: Take 2 tabs (500mg  total) on Day 1. Take 1 tab (250mg ) daily for next 4 days.    Dispense:  6 tablet    Refill:  0   omeprazole (PRILOSEC) 40 MG capsule    Sig: Take 1 capsule (40 mg total) by mouth daily before breakfast.    Dispense:  90 capsule    Refill:  3      Follow up plan: Return in about 6 months (around 04/29/2023) for 6 month follow-up HTN, updates.   Saralyn Pilar, DO [facility] 10/27/2022, 2:23 PM

## 2022-10-27 NOTE — Patient Instructions (Addendum)
Thank you for coming to the office today.  Prevnar-20 today vaccine for pneumonia, good for 5+ years  Keep taking Losartan 50mg  daily  Keep Taking Hydrochlorothiazide 25 mg daily  Restart Omeprazole 40mg  daily before breakfast for stomach acid.  Improve water hydration!  Please schedule a Follow-up Appointment to: Return in about 6 months (around 04/29/2023) for 6 month follow-up HTN, updates.  If you have any other questions or concerns, please feel free to call the office or send a message through MyChart. You may also schedule an earlier appointment if necessary.  Additionally, you may be receiving a survey about your experience at our office within a few days to 1 week by e-mail or mail. We value your feedback.  Saralyn Pilar, DO [facility], [place]

## 2022-11-14 ENCOUNTER — Other Ambulatory Visit: Payer: Self-pay | Admitting: Family Medicine

## 2022-11-14 DIAGNOSIS — N183 Chronic kidney disease, stage 3 unspecified: Secondary | ICD-10-CM

## 2022-11-15 NOTE — Telephone Encounter (Signed)
Unable to refill per protocol, Rx request is too soon. Last refill 10/27/22 for 90 and 3 refills.  Requested Prescriptions  Pending Prescriptions Disp Refills   hydrochlorothiazide (HYDRODIURIL) 25 MG tablet [Pharmacy Med Name: HYDROCHLOROTHIAZIDE 25MG  TABLETS] 90 tablet 3    Sig: TAKE 1 TABLET(25 MG) BY MOUTH DAILY     Cardiovascular: Diuretics - Thiazide Failed - 11/14/2022  7:03 AM      Failed - Cr in normal range and within 180 days    Creat  Date Value Ref Range Status  07/05/2022 1.33 (H) 0.70 - 1.22 mg/dL Final         Passed - K in normal range and within 180 days    Potassium  Date Value Ref Range Status  07/05/2022 4.3 3.5 - 5.3 mmol/L Final         Passed - Na in normal range and within 180 days    Sodium  Date Value Ref Range Status  07/05/2022 136 135 - 146 mmol/L Final         Passed - Last BP in normal range    BP Readings from Last 1 Encounters:  10/27/22 126/82         Passed - Valid encounter within last 6 months    Recent Outpatient Visits           2 weeks ago Centrilobular emphysema [facility])   [facility] Smitty Cords, DO   8 months ago Skin nodule   [facility] Smitty Cords, DO   1 year ago Benign hypertension with CKD (chronic kidney disease) stage III [facility])   [facility] Smitty Cords, DO   1 year ago Bilateral impacted cerumen   [facility] Smitty Cords, DO   1 year ago Benign hypertension with CKD (chronic kidney disease) stage III [facility])   [facility], Netta Neat, DO       Future Appointments             In 5 months Althea Charon, Netta Neat, DO [facility], [facility]

## 2023-01-08 ENCOUNTER — Encounter: Payer: Self-pay | Admitting: Family Medicine

## 2023-01-08 ENCOUNTER — Ambulatory Visit (INDEPENDENT_AMBULATORY_CARE_PROVIDER_SITE_OTHER): Payer: 59 | Admitting: Family Medicine

## 2023-01-08 VITALS — BP 142/82 | HR 58 | Ht 70.0 in | Wt 143.0 lb

## 2023-01-08 DIAGNOSIS — L98491 Non-pressure chronic ulcer of skin of other sites limited to breakdown of skin: Secondary | ICD-10-CM

## 2023-01-08 DIAGNOSIS — L570 Actinic keratosis: Secondary | ICD-10-CM | POA: Diagnosis not present

## 2023-01-08 DIAGNOSIS — Z8589 Personal history of malignant neoplasm of other organs and systems: Secondary | ICD-10-CM

## 2023-01-08 DIAGNOSIS — L989 Disorder of the skin and subcutaneous tissue, unspecified: Secondary | ICD-10-CM | POA: Diagnosis not present

## 2023-01-08 MED ORDER — SILVER SULFADIAZINE 1 % EX CREA
1.0000 | TOPICAL_CREAM | Freq: Every day | CUTANEOUS | 0 refills | Status: AC
Start: 2023-01-08 — End: ?

## 2023-01-08 NOTE — Patient Instructions (Addendum)
Thank you for coming to the office today.  Use healing cream Silvadene cream on the skin sore on upper arm once daily for 2 to 4 weeks.  -----------  I will send a new referral back to Dermatology and they will contact you with  an appointment.  [facility] [place]  [address], [place] [address]  [phone]  Cruzita Lederer, MD  [address]  [address]  [place], [place] [address]  [phone] (Work)    Cruzita Lederer, MD  [address]  [address]  [place], [place] [address]  [phone] (Work)      Please schedule a Follow-up Appointment to: Return if symptoms worsen or fail to improve.  If you have any other questions or concerns, please feel free to call the office or send a message through MyChart. You may also schedule an earlier appointment if necessary.  Additionally, you may be receiving a survey about your experience at our office within a few days to 1 week by e-mail or mail. We value your feedback.  Saralyn Pilar, DO [facility], [place]

## 2023-01-08 NOTE — Progress Notes (Addendum)
Subjective:    Patient ID: Alexander Rowe, male    DOB: 12-Mar-1941, 82 y.o.   MRN: [id number]  Alexander Rowe is a 82 y.o. male presenting on 01/08/2023 for Skin Cancer (Patient would like several spots looked at that he believes are skin cancer)   HPI  Discussed the use of AI scribe software for clinical note transcription with the patient, who gave verbal consent to proceed.  History of Present Illness   Skin Lesions / AK / Non Healing Ulceration Strong history of skin cancer, followed by [facility] / [facility]   The patient presents with multiple skin lesions, including two on the arm and one on the face. They describe the lesions as slow to heal despite attempts at self-treatment with various antibiotic creams.   They have previously seen a dermatologist and had a lesion removed from the face. The patient also mentions having kidney issues and is scheduled to see a specialist at the [facility]     Additionally issue with GERD / swallowing problem. He is on Omeprazole 40mg  daily     07/08/2021    2:38 PM 12/31/2019    1:26 PM 09/19/2019   11:28 AM  Depression screen PHQ 2/9  Decreased Interest 0 0 0  Down, Depressed, Hopeless 0 0 0  PHQ - 2 Score 0 0 0  Altered sleeping 0    Tired, decreased energy 0    Change in appetite 0    Feeling bad or failure about yourself  0    Trouble concentrating 0    Moving slowly or fidgety/restless 0    Suicidal thoughts 0    PHQ-9 Score 0    Difficult doing work/chores Not difficult at all      Social History   Tobacco Use   Smoking status: Every Day    Current packs/day: 1.00    Average packs/day: 1 pack/day for 50.0 years (50.0 ttl pk-yrs)    Types: Cigarettes   Smokeless tobacco: Current  Vaping Use   Vaping status: Never Used  Substance Use Topics   Alcohol use: Yes    Comment: couple beers weekends   Drug use: No    Review of Systems Per HPI unless specifically indicated above     Objective:    BP (!) 142/82    Pulse (!) 58   Ht 5\' 10"  (1.778 m)   Wt 143 lb (64.9 kg)   SpO2 94%   BMI 20.52 kg/m   Wt Readings from Last 3 Encounters:  01/08/23 143 lb (64.9 kg)  10/27/22 141 lb (64 kg)  03/06/22 146 lb (66.2 kg)    Physical Exam Vitals and nursing note reviewed.  Constitutional:      General: He is not in acute distress.    Appearance: Normal appearance. He is well-developed. He is not diaphoretic.     Comments: Well-appearing, comfortable, cooperative  HENT:     Head: Normocephalic and atraumatic.  Eyes:     General:        Right eye: No discharge.        Left eye: No discharge.     Conjunctiva/sclera: Conjunctivae normal.  Cardiovascular:     Rate and Rhythm: Normal rate.  Pulmonary:     Effort: Pulmonary effort is normal.  Skin:    General: Skin is warm and dry.     Findings: Lesion (multiple rough scaley appearing AKs, 2 prominent on L cheek face and R lower face . also has  non healing ulceration R inner forearm, and larger area on R lateral bicep upper arm.) present. No erythema or rash.  Neurological:     Mental Status: He is alert and oriented to person, place, and time.  Psychiatric:        Mood and Affect: Mood normal.        Behavior: Behavior normal.        Thought Content: Thought content normal.     Comments: Well groomed, good eye contact, normal speech and thoughts    Results for orders placed or performed in visit on 07/05/22  CBC with Differential  Result Value Ref Range   WBC 6.7 3.8 - 10.8 Thousand/uL   RBC 4.20 4.20 - 5.80 Million/uL   Hemoglobin 11.8 (L) 13.2 - 17.1 g/dL   HCT 40.9 (L) 81.1 - 91.4 %   MCV 85.0 80.0 - 100.0 fL   MCH 28.1 27.0 - 33.0 pg   MCHC 33.1 32.0 - 36.0 g/dL   RDW 78.2 95.6 - 21.3 %   Platelets 254 140 - 400 Thousand/uL   MPV 9.7 7.5 - 12.5 fL   Neutro Abs 4,087 1,500 - 7,800 cells/uL   Lymphs Abs 1,762 850 - 3,900 cells/uL   Absolute Monocytes 683 200 - 950 cells/uL   Eosinophils Absolute 114 15 - 500 cells/uL   Basophils  Absolute 54 0 - 200 cells/uL   Neutrophils Relative % 61 %   Total Lymphocyte 26.3 %   Monocytes Relative 10.2 %   Eosinophils Relative 1.7 %   Basophils Relative 0.8 %  Comprehensive Metabolic Panel (CMET)  Result Value Ref Range   Glucose, Bld 94 65 - 139 mg/dL   BUN 28 (H) 7 - 25 mg/dL   Creat 0.86 (H) 5.78 - 1.22 mg/dL   BUN/Creatinine Ratio 21 6 - 22 (calc)   Sodium 136 135 - 146 mmol/L   Potassium 4.3 3.5 - 5.3 mmol/L   Chloride 100 98 - 110 mmol/L   CO2 27 20 - 32 mmol/L   Calcium 9.8 8.6 - 10.3 mg/dL   Total Protein 6.5 6.1 - 8.1 g/dL   Albumin 4.1 3.6 - 5.1 g/dL   Globulin 2.4 1.9 - 3.7 g/dL (calc)   AG Ratio 1.7 1.0 - 2.5 (calc)   Total Bilirubin 0.5 0.2 - 1.2 mg/dL   Alkaline phosphatase (APISO) 56 35 - 144 U/L   AST 11 10 - 35 U/L   ALT 9 9 - 46 U/L  HgB A1c  Result Value Ref Range   Hgb A1c MFr Bld 5.8 (H) <5.7 % of total Hgb   Mean Plasma Glucose 120 mg/dL   eAG (mmol/L) 6.6 mmol/L  PSA  Result Value Ref Range   PSA 4.21 (H) < OR = 4.00 ng/mL  Lipid panel  Result Value Ref Range   Cholesterol 192 <200 mg/dL   HDL 67 > OR = 40 mg/dL   Triglycerides 76 <469 mg/dL   LDL Cholesterol (Calc) 108 (H) mg/dL (calc)   Total CHOL/HDL Ratio 2.9 <5.0 (calc)   Non-HDL Cholesterol (Calc) 125 <130 mg/dL (calc)      Assessment & Plan:   Problem List Items Addressed This Visit   None Visit Diagnoses     Chronic skin ulcer, limited to breakdown of skin (HCC)    -  Primary   Relevant Medications   silver sulfADIAZINE (SILVADENE) 1 % cream   Other Relevant Orders   Ambulatory referral to Dermatology   Non-healing skin lesion  Relevant Orders   Ambulatory referral to Dermatology   AK (actinic keratosis)       Relevant Orders   Ambulatory referral to Dermatology   History of squamous cell carcinoma       Relevant Orders   Ambulatory referral to Dermatology       Assessment and Plan    Skin Lesions Multiple non-healing skin lesions on the arm and face.  Patient has tried various over-the-counter creams without success. Lesions are slow to heal and have a rough texture. No signs of infection noted. -Refer back to his existing [facility] dermatology for evaluation and possible cryotherapy for recurrent AKs -Prescribe Silvadene cream to apply once daily for two weeks to promote healing of R upper extremity ulceration  Follow-up Patient has a follow-up appointment scheduled in January 2025 for kidney-related issues. -Continue to monitor kidney function at the next appointment.      Orders Placed This Encounter  Procedures   Ambulatory referral to Dermatology    Referral Priority:   Routine    Referral Type:   Consultation    Referral Reason:   Specialty Services Required    Requested Specialty:   Dermatology    Number of Visits Requested:   1     Meds ordered this encounter  Medications   silver sulfADIAZINE (SILVADENE) 1 % cream    Sig: Apply 1 Application topically daily. For 2 to 4 weeks    Dispense:  50 g    Refill:  0      Follow up plan: Return if symptoms worsen or fail to improve.   Saralyn Pilar, DO [facility] 01/08/2023, 3:46 PM

## 2023-01-24 ENCOUNTER — Telehealth: Payer: Self-pay | Admitting: Family Medicine

## 2023-01-24 NOTE — Telephone Encounter (Signed)
Called LM 01/24/2023 to schedule AWV   Alexander Rowe; Care Guide Ambulatory Clinical Support [place] [facility] Direct Dial: [phone]

## 2023-02-21 ENCOUNTER — Ambulatory Visit: Payer: 59 | Admitting: Family Medicine

## 2023-04-13 ENCOUNTER — Encounter: Payer: Self-pay | Admitting: Pharmacist

## 2023-04-13 NOTE — Progress Notes (Signed)
   04/13/2023  Patient ID: Alexander Rowe, male   DOB: [birth year], 82 y.o.   MRN: [id number]  Pharmacy Quality Measure Review  This patient is appearing on a report for being at risk of failing the adherence measure for hypertension (ACEi/ARB) medications this calendar year.   Medication: losartan 50 mg  Last fill date: 12/20/2022 for 90 day supply  Attempt to reach patient by telephone, but unable to reach him or leave message.  Estelle Grumbles, PharmD, [facility] [phone]

## 2023-05-01 ENCOUNTER — Ambulatory Visit: Payer: 59 | Admitting: Family Medicine

## 2023-05-08 ENCOUNTER — Encounter: Payer: Self-pay | Admitting: Family Medicine

## 2023-05-08 ENCOUNTER — Other Ambulatory Visit: Payer: Self-pay | Admitting: Family Medicine

## 2023-05-08 ENCOUNTER — Ambulatory Visit (INDEPENDENT_AMBULATORY_CARE_PROVIDER_SITE_OTHER): Payer: 59 | Admitting: Family Medicine

## 2023-05-08 VITALS — BP 130/72 | HR 61 | Ht 70.0 in | Wt 144.0 lb

## 2023-05-08 DIAGNOSIS — J432 Centrilobular emphysema: Secondary | ICD-10-CM

## 2023-05-08 DIAGNOSIS — J209 Acute bronchitis, unspecified: Secondary | ICD-10-CM | POA: Diagnosis not present

## 2023-05-08 DIAGNOSIS — R972 Elevated prostate specific antigen [PSA]: Secondary | ICD-10-CM

## 2023-05-08 DIAGNOSIS — E782 Mixed hyperlipidemia: Secondary | ICD-10-CM

## 2023-05-08 DIAGNOSIS — I129 Hypertensive chronic kidney disease with stage 1 through stage 4 chronic kidney disease, or unspecified chronic kidney disease: Secondary | ICD-10-CM

## 2023-05-08 DIAGNOSIS — J440 Chronic obstructive pulmonary disease with acute lower respiratory infection: Secondary | ICD-10-CM | POA: Diagnosis not present

## 2023-05-08 DIAGNOSIS — Z0000 Encounter for general adult medical examination without abnormal findings: Secondary | ICD-10-CM

## 2023-05-08 DIAGNOSIS — R7309 Other abnormal glucose: Secondary | ICD-10-CM

## 2023-05-08 MED ORDER — PREDNISONE 50 MG PO TABS: 50.0000 mg | ORAL_TABLET | Freq: Every day | ORAL | 0 refills | Status: DC

## 2023-05-08 MED ORDER — AZITHROMYCIN 250 MG PO TABS: ORAL_TABLET | ORAL | 0 refills | Status: DC

## 2023-05-08 MED ORDER — BREZTRI AEROSPHERE 160-9-4.8 MCG/ACT IN AERO: 2.0000 | INHALATION_SPRAY | Freq: Two times a day (BID) | RESPIRATORY_TRACT | Status: AC

## 2023-05-08 NOTE — Patient Instructions (Addendum)
 Thank you for coming to the office today.  Likely COPD flare up or pneumonia  Treated today with antibiotic and steroid  Start Azithromycin  Z pak (antibiotic) 2 tabs day 1, then 1 tab x 4 days, complete entire course even if improved  Prednisone  daily with meal for 5 days  Try Breztri  sample inhaler 2 puff twice a day for 7 days, call if you want us  to try to order it.  DUE for FASTING BLOOD WORK (no food or drink after midnight before the lab appointment, only water or coffee without cream/sugar on the morning of)  SCHEDULE Lab Only visit in the morning at the clinic for lab draw in 3 MONTHS   - Make sure Lab Only appointment is at about 1 week before your next appointment, so that results will be available  For Lab Results, once available within 2-3 days of blood draw, you can can log in to MyChart online to view your results and a brief explanation. Also, we can discuss results at next follow-up visit.   Please schedule a Follow-up Appointment to: Return in about 3 months (around 08/06/2023) for 3 month fasting lab > 1 week later Follow-up HTN CKD COPD lab results.  If you have any other questions or concerns, please feel free to call the office or send a message through MyChart. You may also schedule an earlier appointment if necessary.  Additionally, you may be receiving a survey about your experience at our office within a few days to 1 week by e-mail or mail. We value your feedback.  Marsa Officer, DO [facility], [place]

## 2023-05-08 NOTE — Progress Notes (Signed)
 Subjective:    Patient ID: Alexander Rowe, male    DOB: [birth year], 83 y.o.   MRN: [id number]  Alexander Rowe is a 83 y.o. male presenting on 05/08/2023 for No chief complaint on file.   HPI  Discussed the use of AI scribe software for clinical note transcription with the patient, who gave verbal consent to proceed.  History of Present Illness    Acute Exacerbation / Chronic COPD Emphysema  The patient presents with a chief complaint of discomfort in the right lung area, which has been present for approximately a week. He describes a sensation of congestion, particularly in the mornings and evenings, and has been experiencing a cough. The patient suspects that the discomfort may be related to his lungs and reports a feeling of wheezing, particularly in the mornings. He has been self-medicating with over-the-counter drugs, but has not taken any prescribed medications for this issue. The patient has a history of COPD and has been experiencing increased mucus production. He has a family history of COPD, with a brother who is on a machine for the condition. - Not on inhaler  Dermatology / Skin cancer In addition to the lung discomfort, the patient also reports a persistent issue on his face, which has been diagnosed as skin cancer. He is scheduled for treatment upcoming      07/08/2021    2:38 PM 12/31/2019    1:26 PM 09/19/2019   11:28 AM  Depression screen PHQ 2/9  Decreased Interest 0 0 0  Down, Depressed, Hopeless 0 0 0  PHQ - 2 Score 0 0 0  Altered sleeping 0    Tired, decreased energy 0    Change in appetite 0    Feeling bad or failure about yourself  0    Trouble concentrating 0    Moving slowly or fidgety/restless 0    Suicidal thoughts 0    PHQ-9 Score 0    Difficult doing work/chores Not difficult at all         07/08/2021    2:38 PM  GAD 7 : Generalized Anxiety Score  Nervous, Anxious, on Edge 0  Control/stop worrying 0  Worry too much - different things 0   Trouble relaxing 0  Restless 0  Easily annoyed or irritable 0  Afraid - awful might happen 0  Total GAD 7 Score 0  Anxiety Difficulty Not difficult at all    Social History   Tobacco Use   Smoking status: Every Day    Current packs/day: 1.00    Average packs/day: 1 pack/day for 50.0 years (50.0 ttl pk-yrs)    Types: Cigarettes   Smokeless tobacco: Current  Vaping Use   Vaping status: Never Used  Substance Use Topics   Alcohol use: Yes    Comment: couple beers weekends   Drug use: No    Review of Systems  HENT:  Positive for hearing loss.    Per HPI unless specifically indicated above     Objective:    BP 130/72   Pulse 61   Ht 5' 10 (1.778 m)   Wt 144 lb (65.3 kg)   SpO2 98%   BMI 20.66 kg/m   Wt Readings from Last 3 Encounters:  05/08/23 144 lb (65.3 kg)  01/08/23 143 lb (64.9 kg)  10/27/22 141 lb (64 kg)    Physical Exam Vitals and nursing note reviewed.  Constitutional:      General: He is not in acute distress.  Appearance: He is well-developed. He is not diaphoretic.     Comments: Well-appearing, comfortable, cooperative  HENT:     Head: Normocephalic and atraumatic.  Eyes:     General:        Right eye: No discharge.        Left eye: No discharge.     Conjunctiva/sclera: Conjunctivae normal.  Neck:     Thyroid: No thyromegaly.  Cardiovascular:     Rate and Rhythm: Normal rate and regular rhythm.     Pulses: Normal pulses.     Heart sounds: Normal heart sounds. No murmur heard. Pulmonary:     Effort: Pulmonary effort is normal. No respiratory distress.     Breath sounds: Wheezing present. No rales.  Musculoskeletal:        General: Normal range of motion.     Cervical back: Normal range of motion and neck supple.  Lymphadenopathy:     Cervical: No cervical adenopathy.  Skin:    General: Skin is warm and dry.     Findings: No erythema or rash.  Neurological:     Mental Status: He is alert and oriented to person, place, and time.  Mental status is at baseline.  Psychiatric:        Behavior: Behavior normal.     Comments: Well groomed, good eye contact, normal speech and thoughts     Results for orders placed or performed in visit on 07/05/22  CBC with Differential   Collection Time: 07/05/22  8:07 AM  Result Value Ref Range   WBC 6.7 3.8 - 10.8 Thousand/uL   RBC 4.20 4.20 - 5.80 Million/uL   Hemoglobin 11.8 (L) 13.2 - 17.1 g/dL   HCT 64.2 (L) 61.4 - 49.9 %   MCV 85.0 80.0 - 100.0 fL   MCH 28.1 27.0 - 33.0 pg   MCHC 33.1 32.0 - 36.0 g/dL   RDW 86.2 88.9 - 84.9 %   Platelets 254 140 - 400 Thousand/uL   MPV 9.7 7.5 - 12.5 fL   Neutro Abs 4,087 1,500 - 7,800 cells/uL   Lymphs Abs 1,762 850 - 3,900 cells/uL   Absolute Monocytes 683 200 - 950 cells/uL   Eosinophils Absolute 114 15 - 500 cells/uL   Basophils Absolute 54 0 - 200 cells/uL   Neutrophils Relative % 61 %   Total Lymphocyte 26.3 %   Monocytes Relative 10.2 %   Eosinophils Relative 1.7 %   Basophils Relative 0.8 %  Comprehensive Metabolic Panel (CMET)   Collection Time: 07/05/22  8:07 AM  Result Value Ref Range   Glucose, Bld 94 65 - 139 mg/dL   BUN 28 (H) 7 - 25 mg/dL   Creat 8.66 (H) 9.29 - 1.22 mg/dL   BUN/Creatinine Ratio 21 6 - 22 (calc)   Sodium 136 135 - 146 mmol/L   Potassium 4.3 3.5 - 5.3 mmol/L   Chloride 100 98 - 110 mmol/L   CO2 27 20 - 32 mmol/L   Calcium  9.8 8.6 - 10.3 mg/dL   Total Protein 6.5 6.1 - 8.1 g/dL   Albumin 4.1 3.6 - 5.1 g/dL   Globulin 2.4 1.9 - 3.7 g/dL (calc)   AG Ratio 1.7 1.0 - 2.5 (calc)   Total Bilirubin 0.5 0.2 - 1.2 mg/dL   Alkaline phosphatase (APISO) 56 35 - 144 U/L   AST 11 10 - 35 U/L   ALT 9 9 - 46 U/L  HgB A1c   Collection Time: 07/05/22  8:07 AM  Result  Value Ref Range   Hgb A1c MFr Bld 5.8 (H) <5.7 % of total Hgb   Mean Plasma Glucose 120 mg/dL   eAG (mmol/L) 6.6 mmol/L  PSA   Collection Time: 07/05/22  8:07 AM  Result Value Ref Range   PSA 4.21 (H) < OR = 4.00 ng/mL  Lipid panel    Collection Time: 07/05/22  8:07 AM  Result Value Ref Range   Cholesterol 192 <200 mg/dL   HDL 67 > OR = 40 mg/dL   Triglycerides 76 <849 mg/dL   LDL Cholesterol (Calc) 108 (H) mg/dL (calc)   Total CHOL/HDL Ratio 2.9 <5.0 (calc)   Non-HDL Cholesterol (Calc) 125 <130 mg/dL (calc)      Assessment & Plan:   Problem List Items Addressed This Visit     Centrilobular emphysema (HCC)   Relevant Medications   azithromycin  (ZITHROMAX  Z-PAK) 250 MG tablet   predniSONE  (DELTASONE ) 50 MG tablet   Budeson-Glycopyrrol-Formoterol (BREZTRI  AEROSPHERE) 160-9-4.8 MCG/ACT AERO   Other Visit Diagnoses       Acute bronchitis with COPD (HCC)    -  Primary   Relevant Medications   azithromycin  (ZITHROMAX  Z-PAK) 250 MG tablet   predniSONE  (DELTASONE ) 50 MG tablet   Budeson-Glycopyrrol-Formoterol (BREZTRI  AEROSPHERE) 160-9-4.8 MCG/ACT AERO         COPD exacerbation Recent onset of right-sided chest discomfort, cough, and wheezing. No mention of fever, chills, or purulent sputum. Auscultation revealed wheezing. -Start a 5-day course of an antibiotic Zpak and a prednisone  steroid to treat the COPD exacerbation. -Provide a one-week sample of a daily inhaler Breztri  (2 puffs in the morning and 2 puffs in the evening) for the patient to try. If effective, consider prescribing long-term.  Skin Cancer Lesion on the face, scheduled for treatment on 05/10/2023. -No changes to the current plan.  GERD Patient stopped taking medication for stomach acid due to perceived side effects. No current complaints of heartburn or acid reflux. -No changes to the current plan.  Hypertension Mentioned in the conversation but no current complaints or changes. -No changes to the current plan.  General Health Maintenance / Followup Plans -Order routine blood work for 3 months from today (April 2025) to monitor kidney function. -Schedule a follow-up appointment in 3 months (April 2025).         No orders of the  defined types were placed in this encounter.   Meds ordered this encounter  Medications   azithromycin  (ZITHROMAX  Z-PAK) 250 MG tablet    Sig: Take 2 tabs (500mg  total) on Day 1. Take 1 tab (250mg ) daily for next 4 days.    Dispense:  6 tablet    Refill:  0   predniSONE  (DELTASONE ) 50 MG tablet    Sig: Take 1 tablet (50 mg total) by mouth daily with breakfast.    Dispense:  5 tablet    Refill:  0   Budeson-Glycopyrrol-Formoterol (BREZTRI  AEROSPHERE) 160-9-4.8 MCG/ACT AERO    Sig: Inhale 2 puffs into the lungs 2 (two) times daily.    Lot Number?:   [id number]    Expiration Date?:   05/25/2025    Manufacturer?:   [employer] [[id number]]    Follow up plan: Return in about 3 months (around 08/06/2023) for 3 month fasting lab > 1 week later Follow-up HTN CKD COPD lab results.  Future labs ordered for 08/07/23   Marsa Officer, DO [facility] 05/08/2023, 3:15 PM

## 2023-05-10 DIAGNOSIS — C44319 Basal cell carcinoma of skin of other parts of face: Secondary | ICD-10-CM | POA: Diagnosis not present

## 2023-05-10 DIAGNOSIS — L821 Other seborrheic keratosis: Secondary | ICD-10-CM | POA: Diagnosis not present

## 2023-05-10 DIAGNOSIS — L814 Other melanin hyperpigmentation: Secondary | ICD-10-CM | POA: Diagnosis not present

## 2023-05-10 DIAGNOSIS — L570 Actinic keratosis: Secondary | ICD-10-CM | POA: Diagnosis not present

## 2023-05-10 DIAGNOSIS — Z8589 Personal history of malignant neoplasm of other organs and systems: Secondary | ICD-10-CM | POA: Diagnosis not present

## 2023-05-10 DIAGNOSIS — D229 Melanocytic nevi, unspecified: Secondary | ICD-10-CM | POA: Diagnosis not present

## 2023-05-10 DIAGNOSIS — C44612 Basal cell carcinoma of skin of right upper limb, including shoulder: Secondary | ICD-10-CM | POA: Diagnosis not present

## 2023-05-10 DIAGNOSIS — D492 Neoplasm of unspecified behavior of bone, soft tissue, and skin: Secondary | ICD-10-CM | POA: Diagnosis not present

## 2023-05-10 DIAGNOSIS — L578 Other skin changes due to chronic exposure to nonionizing radiation: Secondary | ICD-10-CM | POA: Diagnosis not present

## 2023-07-09 DIAGNOSIS — C44319 Basal cell carcinoma of skin of other parts of face: Secondary | ICD-10-CM | POA: Diagnosis not present

## 2023-07-09 DIAGNOSIS — C44612 Basal cell carcinoma of skin of right upper limb, including shoulder: Secondary | ICD-10-CM | POA: Diagnosis not present

## 2023-08-07 ENCOUNTER — Other Ambulatory Visit: Payer: Self-pay

## 2023-08-07 DIAGNOSIS — N183 Chronic kidney disease, stage 3 unspecified: Secondary | ICD-10-CM

## 2023-08-07 DIAGNOSIS — J432 Centrilobular emphysema: Secondary | ICD-10-CM

## 2023-08-07 DIAGNOSIS — E782 Mixed hyperlipidemia: Secondary | ICD-10-CM

## 2023-08-07 DIAGNOSIS — R972 Elevated prostate specific antigen [PSA]: Secondary | ICD-10-CM

## 2023-08-07 DIAGNOSIS — R7309 Other abnormal glucose: Secondary | ICD-10-CM

## 2023-08-07 DIAGNOSIS — Z0000 Encounter for general adult medical examination without abnormal findings: Secondary | ICD-10-CM

## 2023-08-15 ENCOUNTER — Ambulatory Visit: Payer: Self-pay | Admitting: Family Medicine

## 2023-09-04 DIAGNOSIS — H905 Unspecified sensorineural hearing loss: Secondary | ICD-10-CM | POA: Diagnosis not present

## 2023-11-27 ENCOUNTER — Other Ambulatory Visit: Payer: Self-pay | Admitting: Family Medicine

## 2023-11-27 DIAGNOSIS — N183 Chronic kidney disease, stage 3 unspecified: Secondary | ICD-10-CM

## 2023-11-27 DIAGNOSIS — I129 Hypertensive chronic kidney disease with stage 1 through stage 4 chronic kidney disease, or unspecified chronic kidney disease: Secondary | ICD-10-CM

## 2023-11-28 NOTE — Telephone Encounter (Signed)
 Requested medication (s) are due for refill today:   Yes  Requested medication (s) are on the active medication list:   Yes  Future visit scheduled:   No.   LOV 05/08/2023.    Been a No Show for several appts and cancellations   Last ordered: 7/5/20224 #90, 3 refills  Unable to refill because he has been a No Show and cancelled his last several appts.   Labs due.   Requested Prescriptions  Pending Prescriptions Disp Refills   losartan  (COZAAR ) 50 MG tablet [Pharmacy Med Name: LOSARTAN  50MG  TABLETS] 90 tablet 3    Sig: TAKE 1 TABLET(50 MG) BY MOUTH DAILY     Cardiovascular:  Angiotensin Receptor Blockers Failed - 11/28/2023  2:56 PM      Failed - Cr in normal range and within 180 days    Creat  Date Value Ref Range Status  07/05/2022 1.33 (H) 0.70 - 1.22 mg/dL Final         Failed - K in normal range and within 180 days    Potassium  Date Value Ref Range Status  07/05/2022 4.3 3.5 - 5.3 mmol/L Final         Failed - Valid encounter within last 6 months    Recent Outpatient Visits   None            Passed - Patient is not pregnant      Passed - Last BP in normal range    BP Readings from Last 1 Encounters:  05/08/23 130/72

## 2023-12-09 ENCOUNTER — Other Ambulatory Visit: Payer: Self-pay | Admitting: Family Medicine

## 2023-12-09 DIAGNOSIS — N183 Hypertensive chronic kidney disease with stage 1 through stage 4 chronic kidney disease, or unspecified chronic kidney disease: Secondary | ICD-10-CM

## 2023-12-11 NOTE — Telephone Encounter (Signed)
 Requested medications are due for refill today.  yes  Requested medications are on the active medications list.  yes  Last refill. 10/27/2022 #90 3 rf  Future visit scheduled.   no  Notes to clinic.  Labs are expired. Pt missed a recent appt.    Requested Prescriptions  Pending Prescriptions Disp Refills   hydrochlorothiazide  (HYDRODIURIL ) 25 MG tablet [Pharmacy Med Name: HYDROCHLOROTHIAZIDE  25MG  TABLETS] 90 tablet 3    Sig: TAKE 1 TABLET(25 MG) BY MOUTH DAILY     Cardiovascular: Diuretics - Thiazide Failed - 12/11/2023  4:55 PM      Failed - Cr in normal range and within 180 days    Creat  Date Value Ref Range Status  07/05/2022 1.33 (H) 0.70 - 1.22 mg/dL Final         Failed - K in normal range and within 180 days    Potassium  Date Value Ref Range Status  07/05/2022 4.3 3.5 - 5.3 mmol/L Final         Failed - Na in normal range and within 180 days    Sodium  Date Value Ref Range Status  07/05/2022 136 135 - 146 mmol/L Final         Failed - Valid encounter within last 6 months    Recent Outpatient Visits   None            Passed - Last BP in normal range    BP Readings from Last 1 Encounters:  05/08/23 130/72

## 2024-03-14 ENCOUNTER — Encounter: Payer: Self-pay | Admitting: Pharmacist

## 2024-03-14 NOTE — Progress Notes (Signed)
   03/14/2024  Patient ID: Alexander Rowe, male   DOB: 1940/10/18, 83 y.o.   MRN: [id number]  This patient is appearing on a report for the adherence measure for hypertension (ACEi/ARB) medications this calendar year.   Medication: losartan  50 mg Last fill date: 02/28/2024 for 90 day supply  Insurance report was not up to date. No action needed at this time.   Sharyle Sia, PharmD, [facility] [phone]

## 2024-05-05 ENCOUNTER — Ambulatory Visit: Admitting: Family Medicine

## 2024-05-05 ENCOUNTER — Encounter: Payer: Self-pay | Admitting: Family Medicine

## 2024-05-05 VITALS — BP 152/88 | HR 74 | Ht 70.0 in | Wt 147.2 lb

## 2024-05-05 DIAGNOSIS — J209 Acute bronchitis, unspecified: Secondary | ICD-10-CM | POA: Diagnosis not present

## 2024-05-05 DIAGNOSIS — J440 Chronic obstructive pulmonary disease with acute lower respiratory infection: Secondary | ICD-10-CM | POA: Diagnosis not present

## 2024-05-05 DIAGNOSIS — Z8589 Personal history of malignant neoplasm of other organs and systems: Secondary | ICD-10-CM

## 2024-05-05 DIAGNOSIS — J432 Centrilobular emphysema: Secondary | ICD-10-CM | POA: Diagnosis not present

## 2024-05-05 DIAGNOSIS — E782 Mixed hyperlipidemia: Secondary | ICD-10-CM | POA: Diagnosis not present

## 2024-05-05 DIAGNOSIS — L98491 Non-pressure chronic ulcer of skin of other sites limited to breakdown of skin: Secondary | ICD-10-CM

## 2024-05-05 DIAGNOSIS — L989 Disorder of the skin and subcutaneous tissue, unspecified: Secondary | ICD-10-CM | POA: Diagnosis not present

## 2024-05-05 DIAGNOSIS — R7309 Other abnormal glucose: Secondary | ICD-10-CM

## 2024-05-05 DIAGNOSIS — N183 Chronic kidney disease, stage 3 unspecified: Secondary | ICD-10-CM

## 2024-05-05 DIAGNOSIS — I129 Hypertensive chronic kidney disease with stage 1 through stage 4 chronic kidney disease, or unspecified chronic kidney disease: Secondary | ICD-10-CM | POA: Diagnosis not present

## 2024-05-05 MED ORDER — LOSARTAN POTASSIUM 100 MG PO TABS: 100.0000 mg | ORAL_TABLET | Freq: Every day | ORAL | 1 refills | Status: AC

## 2024-05-05 NOTE — Progress Notes (Unsigned)
 "  Subjective:    Patient ID: Alexander Rowe, male    DOB: 1940-08-01, 84 y.o.   MRN: [id number]  Alexander Rowe is a 84 y.o. male presenting on 05/05/2024 for Medical Management of Chronic Issues   HPI   Discussed the use of AI scribe software for clinical note transcription with the patient, who gave verbal consent to proceed.  History of Present Illness   Alexander Rowe is an 84 year old male who presents with insurance issues affecting his ability to undergo surgery for a facial tumor.  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 but has not healed properly - Suspects recurrence of same issue as before - Tumor has been present for a prolonged period without improvement - Concerned about need for further surgical intervention - Surgical treatment delayed due to insurance coverage issues; current surgeon is out of network - Exploring options for surgery at a regional hospital closer to home  Upper respiratory symptoms - Mucus production, particularly at night - Symptoms consistent with head cold or sinus issue - Considering possibility of sinus infection - No recent influenza vaccination - Occasional coughing  Dysphagia - Sensation of food stopping in throat  Hypertension and medication management - History of high blood pressure - Currently taking one antihypertensive medication at 50 mg dose - Previously taking two antihypertensive medications, but has run out of one prescription - Experiencing leg cramps - Ongoing issues with [facility] benefits and insurance coverage affecting medication access          07/08/2021    2:38 PM 12/31/2019    1:26 PM 09/19/2019   11:28 AM  Depression screen PHQ 2/9  Decreased Interest 0 0 0  Down, Depressed, Hopeless 0 0 0  PHQ - 2 Score 0 0 0  Altered sleeping 0    Tired, decreased energy 0    Change in appetite 0    Feeling bad or failure about yourself  0    Trouble concentrating 0    Moving slowly or  fidgety/restless 0    Suicidal thoughts 0    PHQ-9 Score 0     Difficult doing work/chores Not difficult at all       Data saved with a previous flowsheet row definition       07/08/2021    2:38 PM  GAD 7 : Generalized Anxiety Score  Nervous, Anxious, on Edge 0  Control/stop worrying 0  Worry too much - different things 0  Trouble relaxing 0  Restless 0  Easily annoyed or irritable 0  Afraid - awful might happen 0  Total GAD 7 Score 0  Anxiety Difficulty Not difficult at all    Social History[1]  Review of Systems Per HPI unless specifically indicated above     Objective:    BP (!) 148/82 (BP Location: Left Arm, Cuff Size: Normal)   Pulse 74   Ht 5' 10 (1.778 m)   Wt 147 lb 4 oz (66.8 kg)   SpO2 98%   BMI 21.13 kg/m   Wt Readings from Last 3 Encounters:  05/05/24 147 lb 4 oz (66.8 kg)  05/08/23 144 lb (65.3 kg)  01/08/23 143 lb (64.9 kg)    Physical Exam Vitals and nursing note reviewed.  Constitutional:      General: He is not in acute distress.    Appearance: Normal appearance. He is well-developed. He is not diaphoretic.     Comments: Well-appearing, comfortable, cooperative  HENT:  Head: Normocephalic and atraumatic.  Eyes:     General:        Right eye: No discharge.        Left eye: No discharge.     Conjunctiva/sclera: Conjunctivae normal.  Cardiovascular:     Rate and Rhythm: Normal rate.  Pulmonary:     Effort: Pulmonary effort is normal.  Skin:    General: Skin is warm and dry.     Findings: Lesion (Non healing raised lesion see photo R forearm. multiple rough scaley appearing AKs, 2 prominent on L cheek face and R lower face . also has non healing ulceration R inner forearm, and larger area on R lateral bicep upper arm.) present. No erythema or rash.  Neurological:     Mental Status: He is alert and oriented to person, place, and time.  Psychiatric:        Mood and Affect: Mood normal.        Behavior: Behavior normal.        Thought  Content: Thought content normal.     Comments: Well groomed, good eye contact, normal speech and thoughts     Right forearm    Results for orders placed or performed in visit on 05/05/24  Lipid panel   Collection Time: 05/05/24  4:10 PM  Result Value Ref Range   Cholesterol 178 <200 mg/dL   HDL 74 > OR = 40 mg/dL   Triglycerides 865 <849 mg/dL   LDL Cholesterol (Calc) 81 mg/dL (calc)   Total CHOL/HDL Ratio 2.4 <5.0 (calc)   Non-HDL Cholesterol (Calc) 104 <130 mg/dL (calc)  Hemoglobin Alexander Rowe   Collection Time: 05/05/24  4:10 PM  Result Value Ref Range   Hgb A1c MFr Bld 5.5 <5.7 %   Mean Plasma Glucose 111 mg/dL   eAG (mmol/L) 6.2 mmol/L  CBC with Differential/Platelet   Collection Time: 05/05/24  4:10 PM  Result Value Ref Range   WBC 5.9 3.8 - 10.8 Thousand/uL   RBC 3.99 (L) 4.20 - 5.80 Million/uL   Hemoglobin 11.6 (L) 13.2 - 17.1 g/dL   HCT 65.0 (L) 60.5 - 48.8 %   MCV 87.5 81.4 - 101.7 fL   MCH 29.1 27.0 - 33.0 pg   MCHC 33.2 31.6 - 35.4 g/dL   RDW 87.1 88.9 - 84.9 %   Platelets 214 140 - 400 Thousand/uL   MPV 10.4 7.5 - 12.5 fL   Neutro Abs 3,829 1,500 - 7,800 cells/uL   Absolute Lymphocytes 1,257 850 - 3,900 cells/uL   Absolute Monocytes 690 200 - 950 cells/uL   Eosinophils Absolute 83 15 - 500 cells/uL   Basophils Absolute 41 0 - 200 cells/uL   Neutrophils Relative % 64.9 %   Total Lymphocyte 21.3 %   Monocytes Relative 11.7 %   Eosinophils Relative 1.4 %   Basophils Relative 0.7 %  Comprehensive metabolic panel with GFR   Collection Time: 05/05/24  4:10 PM  Result Value Ref Range   Glucose, Bld 92 65 - 139 mg/dL   BUN 26 (H) 7 - 25 mg/dL   Creat 8.68 (H) 9.29 - 1.22 mg/dL   eGFR 54 (L) > OR = 60 mL/min/1.17m2   BUN/Creatinine Ratio 20 6 - 22 (calc)   Sodium 135 135 - 146 mmol/L   Potassium 4.9 3.5 - 5.3 mmol/L   Chloride 101 98 - 110 mmol/L   CO2 31 20 - 32 mmol/L   Calcium  10.0 8.6 - 10.3 mg/dL   Total Protein 6.7 6.1 -  8.1 g/dL   Albumin 4.4 3.6 - 5.1  g/dL   Globulin 2.3 1.9 - 3.7 g/dL (calc)   AG Ratio 1.9 1.0 - 2.5 (calc)   Total Bilirubin 0.6 0.2 - 1.2 mg/dL   Alkaline phosphatase (APISO) 59 35 - 144 U/L   AST 15 10 - 35 U/L   ALT 12 9 - 46 U/L      Assessment & Plan:   Problem List Items Addressed This Visit     Benign hypertension with CKD (chronic kidney disease) stage III (HCC) - Primary   Relevant Medications   losartan  (COZAAR ) 100 MG tablet   Centrilobular emphysema (HCC)   Relevant Orders   Comprehensive metabolic panel with GFR (Completed)   Hyperlipidemia   Relevant Medications   losartan  (COZAAR ) 100 MG tablet   Other Relevant Orders   Lipid panel (Completed)   CBC with Differential/Platelet (Completed)   Comprehensive metabolic panel with GFR (Completed)   Other Visit Diagnoses       Abnormal glucose       Relevant Orders   Hemoglobin A1c (Completed)     Acute bronchitis with COPD (HCC)         Chronic skin ulcer, limited to breakdown of skin (HCC)       Relevant Orders   Ambulatory referral to Dermatology     Non-healing skin lesion       Relevant Orders   Ambulatory referral to Dermatology     History of squamous cell carcinoma       Relevant Orders   Ambulatory referral to Dermatology        Assessment and Plan    Hypertensive chronic kidney disease stage III Blood pressure elevated at 152 mmHg. Previously on two antihypertensives, now on one. - Increased antihypertensive dosage from 50 mg to 100 mg. - Ordered blood work to monitor kidney function and blood pressure.  Chronic obstructive pulmonary disease with acute bronchitis No recent flu vaccination. - Ordered blood work to assess for infection. - Consider antibiotic therapy if infection suspected.  Skin neoplasm requiring surgical management Requires surgical management. Difficulty finding Financial Trader. Prefers Alexander Rowe but insurance issues present. - Check insurance coverage for Alexander Rowe at [facility]. - Consider  referral to [facility] if Alexander Rowe not covered. - Printed summary with Dr. [place] information.  General health maintenance Overdue for routine blood work. Has not received flu shot. Discussed importance of vaccinations and routine health maintenance. - Ordered routine blood work. - Encouraged flu vaccination.      Ask Alexander about referral to Dermatology if can get back into Dr Rowe or other Derm at [facility] vs Alexander Rowe  Confirmed that patient was not able to schedule w/ Dr Rowe due to [facility] issue in 2026, he is confirmed in network after discussing with Alexander Rowe and she needs new referral to continue care w/ Dr Rowe.  Orders Placed This Encounter  Procedures   Lipid panel    Has the patient fasted?:   No   Hemoglobin A1c   CBC with Differential/Platelet   Comprehensive metabolic panel with GFR    Has the patient fasted?:   No   Ambulatory referral to Dermatology    Referral Priority:   Routine    Referral Type:   Consultation    Referral Reason:   Specialty Services Required    Requested Specialty:   Dermatology    Number of Visits Requested:   1     Meds  ordered this encounter  Medications   losartan  (COZAAR ) 100 MG tablet    Sig: Take 1 tablet (100 mg total) by mouth daily.    Dispense:  90 tablet    Refill:  1    Discontinue 50mg  and start 100mg     Follow up plan: Return if symptoms worsen or fail to improve.   Alexander Officer, DO [facility] 05/05/2024, 3:45 PM     [1]  Social History Tobacco Use   Smoking status: Every Day    Current packs/day: 1.00    Average packs/day: 1 pack/day for 50.0 years (50.0 ttl pk-yrs)    Types: Cigarettes   Smokeless tobacco: Current  Vaping Use   Vaping status: Never Used  Substance Use Topics   Alcohol use: Yes    Comment: couple beers weekends   Drug use: No   "

## 2024-05-05 NOTE — Patient Instructions (Addendum)
 Thank you for coming to the office today.  Stop taking Losartan  50mg  and switch to Losartan  100mg  once daily.  --------------------  Gregorio Adine Rakers, MD  [address]  [id number], [address]  [place], [place] [address]  Phone: [phone]  [phone]   Check insurance coverage for Dr Gregorio in [place]  We do not have a Designer, Fashion/clothing in [place] at [facility].  If you want to change to [facility] - we have to refer to RUTHELLEN Rufus Holy, MD, [facility]  [facility] [address] [place], [place] [address] [phone]  Please schedule a Follow-up Appointment to: Return if symptoms worsen or fail to improve.  If you have any other questions or concerns, please feel free to call the office or send a message through MyChart. You may also schedule an earlier appointment if necessary.  Additionally, you may be receiving a survey about your experience at our office within a few days to 1 week by e-mail or mail. We value your feedback.  Marsa Officer, DO [facility], [place]

## 2024-05-06 LAB — COMPREHENSIVE METABOLIC PANEL WITH GFR
AG Ratio: 1.9 (calc) (ref 1.0–2.5)
ALT: 12 U/L (ref 9–46)
AST: 15 U/L (ref 10–35)
Albumin: 4.4 g/dL (ref 3.6–5.1)
Alkaline phosphatase (APISO): 59 U/L (ref 35–144)
BUN/Creatinine Ratio: 20 (calc) (ref 6–22)
BUN: 26 mg/dL — ABNORMAL HIGH (ref 7–25)
CO2: 31 mmol/L (ref 20–32)
Calcium: 10 mg/dL (ref 8.6–10.3)
Chloride: 101 mmol/L (ref 98–110)
Creat: 1.31 mg/dL — ABNORMAL HIGH (ref 0.70–1.22)
Globulin: 2.3 g/dL (ref 1.9–3.7)
Glucose, Bld: 92 mg/dL (ref 65–139)
Potassium: 4.9 mmol/L (ref 3.5–5.3)
Sodium: 135 mmol/L (ref 135–146)
Total Bilirubin: 0.6 mg/dL (ref 0.2–1.2)
Total Protein: 6.7 g/dL (ref 6.1–8.1)
eGFR: 54 mL/min/1.73m2 — ABNORMAL LOW

## 2024-05-06 LAB — CBC WITH DIFFERENTIAL/PLATELET
Absolute Lymphocytes: 1257 {cells}/uL (ref 850–3900)
Absolute Monocytes: 690 {cells}/uL (ref 200–950)
Basophils Absolute: 41 {cells}/uL (ref 0–200)
Basophils Relative: 0.7 %
Eosinophils Absolute: 83 {cells}/uL (ref 15–500)
Eosinophils Relative: 1.4 %
HCT: 34.9 % — ABNORMAL LOW (ref 39.4–51.1)
Hemoglobin: 11.6 g/dL — ABNORMAL LOW (ref 13.2–17.1)
MCH: 29.1 pg (ref 27.0–33.0)
MCHC: 33.2 g/dL (ref 31.6–35.4)
MCV: 87.5 fL (ref 81.4–101.7)
MPV: 10.4 fL (ref 7.5–12.5)
Monocytes Relative: 11.7 %
Neutro Abs: 3829 {cells}/uL (ref 1500–7800)
Neutrophils Relative %: 64.9 %
Platelets: 214 Thousand/uL (ref 140–400)
RBC: 3.99 Million/uL — ABNORMAL LOW (ref 4.20–5.80)
RDW: 12.8 % (ref 11.0–15.0)
Total Lymphocyte: 21.3 %
WBC: 5.9 Thousand/uL (ref 3.8–10.8)

## 2024-05-06 LAB — HEMOGLOBIN A1C
Hgb A1c MFr Bld: 5.5 %
Mean Plasma Glucose: 111 mg/dL
eAG (mmol/L): 6.2 mmol/L

## 2024-05-06 LAB — LIPID PANEL
Cholesterol: 178 mg/dL
HDL: 74 mg/dL
LDL Cholesterol (Calc): 81 mg/dL
Non-HDL Cholesterol (Calc): 104 mg/dL
Total CHOL/HDL Ratio: 2.4 (calc)
Triglycerides: 134 mg/dL

## 2024-05-06 NOTE — Addendum Note (Signed)
 Addended by: EDMAN MARSA PARAS on: 05/06/2024 06:02 PM   Modules accepted: Orders

## 2024-05-16 ENCOUNTER — Ambulatory Visit: Payer: Self-pay | Admitting: Family Medicine
